# Patient Record
Sex: Female | Born: 1975
Health system: Southern US, Community
[De-identification: ages and names within clinical notes are randomized; demographics above are authoritative.]

## PROBLEM LIST (undated history)

## (undated) ENCOUNTER — Emergency Department (HOSPITAL_COMMUNITY): Payer: Medicaid Other

## (undated) ENCOUNTER — Inpatient Hospital Stay (HOSPITAL_COMMUNITY): Payer: Commercial Managed Care - HMO

## (undated) DIAGNOSIS — F410 Panic disorder [episodic paroxysmal anxiety] without agoraphobia: Secondary | ICD-10-CM

## (undated) DIAGNOSIS — R600 Localized edema: Secondary | ICD-10-CM

## (undated) DIAGNOSIS — Z8659 Personal history of other mental and behavioral disorders: Secondary | ICD-10-CM

## (undated) DIAGNOSIS — D509 Iron deficiency anemia, unspecified: Secondary | ICD-10-CM

## (undated) DIAGNOSIS — K5900 Constipation, unspecified: Secondary | ICD-10-CM

## (undated) DIAGNOSIS — D500 Iron deficiency anemia secondary to blood loss (chronic): Secondary | ICD-10-CM

## (undated) DIAGNOSIS — D259 Leiomyoma of uterus, unspecified: Secondary | ICD-10-CM

## (undated) HISTORY — PX: NO PAST SURGERIES: SHX2092

---

## 2000-03-01 ENCOUNTER — Encounter: Payer: Self-pay | Admitting: Emergency Medicine

## 2000-03-01 ENCOUNTER — Emergency Department (HOSPITAL_COMMUNITY): Admission: EM | Admit: 2000-03-01 | Discharge: 2000-03-01 | Payer: Self-pay | Admitting: Emergency Medicine

## 2000-05-06 ENCOUNTER — Ambulatory Visit (HOSPITAL_COMMUNITY): Admission: RE | Admit: 2000-05-06 | Discharge: 2000-05-06 | Payer: Self-pay | Admitting: *Deleted

## 2000-05-18 ENCOUNTER — Inpatient Hospital Stay (HOSPITAL_COMMUNITY): Admission: AD | Admit: 2000-05-18 | Discharge: 2000-05-18 | Payer: Self-pay | Admitting: *Deleted

## 2000-06-05 ENCOUNTER — Emergency Department (HOSPITAL_COMMUNITY): Admission: EM | Admit: 2000-06-05 | Discharge: 2000-06-05 | Payer: Self-pay | Admitting: Emergency Medicine

## 2000-06-30 ENCOUNTER — Ambulatory Visit (HOSPITAL_COMMUNITY): Admission: RE | Admit: 2000-06-30 | Discharge: 2000-06-30 | Payer: Self-pay | Admitting: Obstetrics & Gynecology

## 2000-07-24 ENCOUNTER — Encounter (HOSPITAL_COMMUNITY): Admission: RE | Admit: 2000-07-24 | Discharge: 2000-08-25 | Payer: Self-pay | Admitting: Obstetrics & Gynecology

## 2000-07-31 ENCOUNTER — Observation Stay (HOSPITAL_COMMUNITY): Admission: AD | Admit: 2000-07-31 | Discharge: 2000-08-01 | Payer: Self-pay | Admitting: Obstetrics

## 2000-08-06 ENCOUNTER — Encounter: Admission: RE | Admit: 2000-08-06 | Discharge: 2000-08-06 | Payer: Self-pay | Admitting: Obstetrics

## 2000-08-13 ENCOUNTER — Encounter: Admission: RE | Admit: 2000-08-13 | Discharge: 2000-08-13 | Payer: Self-pay | Admitting: Obstetrics

## 2000-08-20 ENCOUNTER — Encounter: Admission: RE | Admit: 2000-08-20 | Discharge: 2000-08-20 | Payer: Self-pay | Admitting: Obstetrics

## 2000-08-24 ENCOUNTER — Inpatient Hospital Stay (HOSPITAL_COMMUNITY): Admission: AD | Admit: 2000-08-24 | Discharge: 2000-08-27 | Payer: Self-pay | Admitting: Obstetrics

## 2000-08-24 ENCOUNTER — Encounter (INDEPENDENT_AMBULATORY_CARE_PROVIDER_SITE_OTHER): Payer: Self-pay

## 2000-08-26 HISTORY — PX: TUBAL LIGATION: SHX77

## 2000-08-27 ENCOUNTER — Encounter: Admission: RE | Admit: 2000-08-27 | Discharge: 2000-08-27 | Payer: Self-pay | Admitting: Obstetrics

## 2000-08-28 ENCOUNTER — Encounter: Admission: RE | Admit: 2000-08-28 | Discharge: 2000-09-08 | Payer: Self-pay | Admitting: Obstetrics

## 2002-07-22 ENCOUNTER — Emergency Department (HOSPITAL_COMMUNITY): Admission: EM | Admit: 2002-07-22 | Discharge: 2002-07-22 | Payer: Self-pay | Admitting: Emergency Medicine

## 2002-10-30 ENCOUNTER — Emergency Department (HOSPITAL_COMMUNITY): Admission: EM | Admit: 2002-10-30 | Discharge: 2002-10-30 | Payer: Self-pay | Admitting: Emergency Medicine

## 2003-04-13 ENCOUNTER — Emergency Department (HOSPITAL_COMMUNITY): Admission: EM | Admit: 2003-04-13 | Discharge: 2003-04-13 | Payer: Self-pay | Admitting: Emergency Medicine

## 2003-08-25 ENCOUNTER — Emergency Department (HOSPITAL_COMMUNITY): Admission: EM | Admit: 2003-08-25 | Discharge: 2003-08-25 | Payer: Self-pay | Admitting: *Deleted

## 2004-01-26 ENCOUNTER — Emergency Department (HOSPITAL_COMMUNITY): Admission: EM | Admit: 2004-01-26 | Discharge: 2004-01-26 | Payer: Self-pay | Admitting: Emergency Medicine

## 2004-03-20 ENCOUNTER — Emergency Department (HOSPITAL_COMMUNITY): Admission: EM | Admit: 2004-03-20 | Discharge: 2004-03-20 | Payer: Self-pay | Admitting: Emergency Medicine

## 2004-10-03 ENCOUNTER — Emergency Department (HOSPITAL_COMMUNITY): Admission: EM | Admit: 2004-10-03 | Discharge: 2004-10-03 | Payer: Self-pay | Admitting: Emergency Medicine

## 2004-11-19 ENCOUNTER — Emergency Department (HOSPITAL_COMMUNITY): Admission: EM | Admit: 2004-11-19 | Discharge: 2004-11-19 | Payer: Self-pay | Admitting: Emergency Medicine

## 2004-12-10 ENCOUNTER — Emergency Department (HOSPITAL_COMMUNITY): Admission: EM | Admit: 2004-12-10 | Discharge: 2004-12-10 | Payer: Self-pay | Admitting: Emergency Medicine

## 2005-06-25 ENCOUNTER — Emergency Department (HOSPITAL_COMMUNITY): Admission: EM | Admit: 2005-06-25 | Discharge: 2005-06-25 | Payer: Self-pay | Admitting: Family Medicine

## 2006-04-21 ENCOUNTER — Emergency Department (HOSPITAL_COMMUNITY): Admission: EM | Admit: 2006-04-21 | Discharge: 2006-04-22 | Payer: Self-pay | Admitting: Emergency Medicine

## 2006-10-16 ENCOUNTER — Encounter: Admission: RE | Admit: 2006-10-16 | Discharge: 2006-10-16 | Payer: Self-pay | Admitting: *Deleted

## 2007-03-25 ENCOUNTER — Emergency Department (HOSPITAL_COMMUNITY): Admission: EM | Admit: 2007-03-25 | Discharge: 2007-03-25 | Payer: Self-pay | Admitting: Emergency Medicine

## 2008-08-07 ENCOUNTER — Emergency Department (HOSPITAL_COMMUNITY): Admission: EM | Admit: 2008-08-07 | Discharge: 2008-08-07 | Payer: Self-pay | Admitting: Emergency Medicine

## 2009-12-08 ENCOUNTER — Emergency Department (HOSPITAL_COMMUNITY): Admission: EM | Admit: 2009-12-08 | Discharge: 2009-12-08 | Payer: Self-pay | Admitting: Family Medicine

## 2010-09-12 LAB — RAPID STREP SCREEN (MED CTR MEBANE ONLY): Streptococcus, Group A Screen (Direct): POSITIVE — AB

## 2010-10-18 NOTE — Op Note (Signed)
[facility]  Patient:    Susan Cruz, Susan Cruz                     MRN: [id number] Proc. Date: 08/26/00 Adm. Date:  [id number] Attending:  Tammi Sou                           Operative Report  PREOPERATIVE DIAGNOSIS:       Desires sterilization.  POSTOPERATIVE DIAGNOSIS:      Desires sterilization.  PROCEDURE:                    Bilateral partial salpingectomy                               (Pomeroy technique).  SURGEON:                      Charles A. Clearance Coots, M.D.  ASSISTANT:                    Nancy Fetter, M.D.  ANESTHESIA:                   Epidural.  ESTIMATED BLOOD LOSS:         Negligible.  COMPLICATIONS:                None.  SPECIMENS:                    Approximately 2 cm segments of right and left                               fallopian tubes.  OPERATION:                    The patient was brought to the operating room, and, after satisfactory redosing of the epidural, the abdomen was prepped and draped in the usual sterile fashion.  A small inferior umbilical incision was made with the scalpel, that was deepened down into the fascia with curved Mayo scissors.  The fascia was grasped in the midline with Kelly forceps and with cutting between with curved Mayo scissors.  The fascial incision was extended to the left and the right with the curved Mayo scissors.  Peritoneum was entered bluntly with the right-angled retractors.  The right fallopian tube was identified and was grasped with Babcock clamp.  The tube was followed from the corneal end to the fimbrial end serially, and then grasped with Babcock clamp.  The tube was then regrasped in the isthmic area of the tube with the Babcock clamp.  A knuckle of tube beneath the Babcock clamp was doubly ligated with #1 plain catgut.  Dissection of tube knot was excised with Metzenbaum scissors and submitted to pathology for evaluation.  There was no active bleeding from the  tubal stump; therefore, placed back in the normal anatomic position.  The same procedure was performed on the opposite side without complications.  The abdomen was then closed as follows.  The peritoneum and fascia was closed as one with a continuous suture of 2-0 Vicryl.  The skin was closed with a continuous subcuticular suture of 4-0 Monocryl.  Sterile bandage was applied to the incision closure.  The surgical technician indicated that all sponge, needle and instrument counts were  correct.  The patient tolerated the procedure well and was transported to the recovery room in satisfactory condition. DD:  08/26/00 TD:  08/27/00 Job: [id number]

## 2013-02-08 ENCOUNTER — Encounter (HOSPITAL_COMMUNITY): Payer: Self-pay | Admitting: *Deleted

## 2013-02-08 ENCOUNTER — Emergency Department (INDEPENDENT_AMBULATORY_CARE_PROVIDER_SITE_OTHER)
Admission: EM | Admit: 2013-02-08 | Discharge: 2013-02-08 | Disposition: A | Discharge status: Home / Self Care | Payer: Self-pay | Source: Home / Self Care | Attending: Emergency Medicine | Admitting: Emergency Medicine

## 2013-02-08 DIAGNOSIS — N3000 Acute cystitis without hematuria: Secondary | ICD-10-CM

## 2013-02-08 LAB — POCT URINALYSIS DIP (DEVICE)
Bilirubin Urine: NEGATIVE
Glucose, UA: NEGATIVE mg/dL
Ketones, ur: NEGATIVE mg/dL
Leukocytes, UA: NEGATIVE
Nitrite: POSITIVE — AB
Protein, ur: NEGATIVE mg/dL
Specific Gravity, Urine: 1.025 (ref 1.005–1.030)
Urobilinogen, UA: 0.2 mg/dL (ref 0.0–1.0)
pH: 6 (ref 5.0–8.0)

## 2013-02-08 LAB — POCT PREGNANCY, URINE: Preg Test, Ur: NEGATIVE

## 2013-02-08 MED ORDER — CEPHALEXIN 500 MG PO CAPS
500.0000 mg | ORAL_CAPSULE | Freq: Three times a day (TID) | ORAL | Status: DC
Start: 2013-02-08 — End: 2013-02-15

## 2013-02-08 MED ORDER — PHENAZOPYRIDINE HCL 200 MG PO TABS: 200.0000 mg | ORAL_TABLET | Freq: Three times a day (TID) | ORAL | Status: DC | PRN

## 2013-02-08 NOTE — ED Notes (Signed)
Pt  Reports  Symptoms  Of  Dysuria  As  Well       As         A  Foul  Odor  Present       She reports  She  Was  Treated  For  E  Coli         In her  Urine  And  Was  Given anti biotics    She    denys  Any      Vaginal bleeding    Or  Overt  Discharge     She  Ambulated  To room with a  Slow  Steady  Gait  She  Is  Sitting  Upright on  Exam table  In no  Acute  Distress

## 2013-02-08 NOTE — ED Provider Notes (Signed)
Chief Complaint:   Chief Complaint  Patient presents with  . Dysuria    History of Present Illness:   Susan Cruz is a 37 year old female who has had a 2 month history of urinary odor and a one-month history of dysuria, frequency, and lower bowel pain. She denies any fever, chills, nausea, vomiting, hematuria, or GYN complaints. She has a history of an Escherichia coli urinary tract infection about 6 months ago.  Review of Systems:  Other than noted above, the patient denies any of the following symptoms: General:  No fevers, chills, sweats, aches, or fatigue. GI:  No abdominal pain, back pain, nausea, vomiting, diarrhea, or constipation. GU:  No dysuria, frequency, urgency, hematuria, urethral discharge, penile lesions, penile pain, testicular pain, swelling, or mass, inguinal lymphadenopathy or incontinence.  PMFSH:  Past medical history, family history, social history, meds, and allergies were reviewed.    Physical Exam:   Vital signs:  BP 126/82  Pulse 72  Temp(Src) 98.1 F (36.7 C) (Oral)  Resp 16  SpO2 99%  LMP 01/11/2013 Gen:  Alert, oriented, in no distress. Lungs:  Clear to auscultation, no wheezes, rales or rhonchi. Heart:  Regular rhythm, no gallop or murmer. Abdomen:  Flat and soft.  No tenderness to palpation, guarding, or rebound.  No hepato-splenomegaly or mass.  Bowel sounds were normally active.  No hernia. Back:  No CVA tenderness.  Skin:  Clear, warm and dry.  Labs:   Results for orders placed during the hospital encounter of 02/08/13  POCT URINALYSIS DIP (DEVICE)      Result Value Range   Glucose, UA NEGATIVE  NEGATIVE mg/dL   Bilirubin Urine NEGATIVE  NEGATIVE   Ketones, ur NEGATIVE  NEGATIVE mg/dL   Specific Gravity, Urine 1.025  1.005 - 1.030   Hgb urine dipstick MODERATE (*) NEGATIVE   pH 6.0  5.0 - 8.0   Protein, ur NEGATIVE  NEGATIVE mg/dL   Urobilinogen, UA 0.2  0.0 - 1.0 mg/dL   Nitrite POSITIVE (*) NEGATIVE   Leukocytes, UA NEGATIVE   NEGATIVE  POCT PREGNANCY, URINE      Result Value Range   Preg Test, Ur NEGATIVE  NEGATIVE    A urine culture was obtained.  Assessment: The encounter diagnosis was Acute cystitis.   No evidence of pyelonephritis.  Plan:   1.  The following meds were prescribed:   Discharge Medication List as of 02/08/2013  1:32 PM    START taking these medications   Details  cephALEXin (KEFLEX) 500 MG capsule Take 1 capsule (500 mg total) by mouth 3 (three) times daily., Starting 02/08/2013, Until Discontinued, Normal    phenazopyridine (PYRIDIUM) 200 MG tablet Take 1 tablet (200 mg total) by mouth 3 (three) times daily as needed for pain., Starting 02/08/2013, Until Discontinued, Normal       2.  The patient was instructed in symptomatic care and handouts were given. Suggested she take a probiotic and was given refills on the cephalexin so she could take one of these after intercourse. She states that symptoms do seem to correlate with sexual activity. 3.  The patient was told to return if becoming worse in any way, if no better in 3 or 4 days, and given some red flag symptoms such as fever, persistent vomiting, or severe pain that would indicate earlier return. 4.  Follow up here if necessary.     Reuben Likes, MD 02/08/13 2138

## 2013-02-09 LAB — URINE CULTURE
Colony Count: >=100000
Special Requests: NORMAL

## 2013-02-10 NOTE — ED Notes (Signed)
Urine culture: >100,000 colonies E. Coli.  Pt. adequately treated with Keflex. Susan Cruz 02/10/2013

## 2013-02-15 ENCOUNTER — Encounter (HOSPITAL_COMMUNITY): Payer: Self-pay

## 2013-02-15 ENCOUNTER — Emergency Department (HOSPITAL_COMMUNITY)
Admission: EM | Admit: 2013-02-15 | Discharge: 2013-02-15 | Disposition: A | Discharge status: Home / Self Care | Payer: Self-pay | Attending: Emergency Medicine | Admitting: Emergency Medicine

## 2013-02-15 DIAGNOSIS — R55 Syncope and collapse: Secondary | ICD-10-CM | POA: Insufficient documentation

## 2013-02-15 DIAGNOSIS — Z3202 Encounter for pregnancy test, result negative: Secondary | ICD-10-CM | POA: Insufficient documentation

## 2013-02-15 DIAGNOSIS — R5381 Other malaise: Secondary | ICD-10-CM | POA: Insufficient documentation

## 2013-02-15 LAB — CBC WITH DIFFERENTIAL/PLATELET
Basophils Absolute: 0 10*3/uL (ref 0.0–0.1)
Basophils Relative: 0 % (ref 0–1)
Eosinophils Absolute: 0 10*3/uL (ref 0.0–0.7)
Eosinophils Relative: 1 % (ref 0–5)
HCT: 37.1 % (ref 36.0–46.0)
Hemoglobin: 12.6 g/dL (ref 12.0–15.0)
Lymphocytes Relative: 23 % (ref 12–46)
Lymphs Abs: 1.1 10*3/uL (ref 0.7–4.0)
MCH: 29.8 pg (ref 26.0–34.0)
MCHC: 34 g/dL (ref 30.0–36.0)
MCV: 87.7 fL (ref 78.0–100.0)
Monocytes Absolute: 0.4 10*3/uL (ref 0.1–1.0)
Monocytes Relative: 9 % (ref 3–12)
Neutro Abs: 3.1 10*3/uL (ref 1.7–7.7)
Neutrophils Relative %: 67 % (ref 43–77)
Platelets: 207 10*3/uL (ref 150–400)
RBC: 4.23 MIL/uL (ref 3.87–5.11)
RDW: 12.5 % (ref 11.5–15.5)
WBC: 4.6 10*3/uL (ref 4.0–10.5)

## 2013-02-15 LAB — URINALYSIS, ROUTINE W REFLEX MICROSCOPIC
Bilirubin Urine: NEGATIVE
Glucose, UA: NEGATIVE mg/dL
Hgb urine dipstick: NEGATIVE
Ketones, ur: NEGATIVE mg/dL
Leukocytes, UA: NEGATIVE
Nitrite: NEGATIVE
Protein, ur: NEGATIVE mg/dL
Specific Gravity, Urine: 1.023 (ref 1.005–1.030)
Urobilinogen, UA: 1 mg/dL (ref 0.0–1.0)
pH: 6 (ref 5.0–8.0)

## 2013-02-15 LAB — COMPREHENSIVE METABOLIC PANEL
ALT: 5 U/L (ref 0–35)
AST: 16 U/L (ref 0–37)
Albumin: 3.7 g/dL (ref 3.5–5.2)
Alkaline Phosphatase: 43 U/L (ref 39–117)
BUN: 13 mg/dL (ref 6–23)
CO2: 25 mEq/L (ref 19–32)
Calcium: 9.1 mg/dL (ref 8.4–10.5)
Chloride: 102 mEq/L (ref 96–112)
Creatinine, Ser: 0.97 mg/dL (ref 0.50–1.10)
GFR calc Af Amer: 87 mL/min — ABNORMAL LOW (ref >90)
GFR calc non Af Amer: 75 mL/min — ABNORMAL LOW (ref >90)
Glucose, Bld: 124 mg/dL — ABNORMAL HIGH (ref 70–99)
Potassium: 3.4 mEq/L — ABNORMAL LOW (ref 3.5–5.1)
Sodium: 137 mEq/L (ref 135–145)
Total Bilirubin: 0.4 mg/dL (ref 0.3–1.2)
Total Protein: 6.9 g/dL (ref 6.0–8.3)

## 2013-02-15 LAB — PREGNANCY, URINE: Preg Test, Ur: NEGATIVE

## 2013-02-15 MED ORDER — SODIUM CHLORIDE 0.9 % IV BOLUS (SEPSIS)
1000.0000 mL | Freq: Once | INTRAVENOUS | Status: AC
  Administered 2013-02-15: 1000 mL via INTRAVENOUS

## 2013-02-15 NOTE — ED Notes (Signed)
Pt continues to wait for initial MD assessment.

## 2013-02-15 NOTE — ED Notes (Signed)
Patient reports that she was walking to the store and felt dizzy, then she felt like she was going to pass out. Patient states she has not eaten today. Patient states she has been drinking fluids.Patient denies any history of seizures, diabetes.

## 2013-02-15 NOTE — ED Notes (Signed)
Staff gave pt ginger ale, and saltine crackers

## 2013-02-15 NOTE — ED Provider Notes (Signed)
CSN: [id number]     Arrival date & time 02/15/13  1750 History   First MD Initiated Contact with Patient 02/15/13 1939     Chief Complaint  Patient presents with  . lethargic   . Dizziness   (Consider location/radiation/quality/duration/timing/severity/associated sxs/prior Treatment) HPI Pt presenting with c/o syncope.  She states she was walking to the store and felt somewhat weak and dizzy on her way.  Does not remember events of syncope but was inside store and afterwards people were around her helping her.  No report of seizure activity.  Denies chest pain, no palpitations, no severe headache.  She states she had not eaten today or had much to drink.  Felt somewhat weak upon arrival, has been improving with fluids.  There are no other associated systemic symptoms, there are no other alleviating or modifying factors.   History reviewed. No pertinent past medical history. History reviewed. No pertinent past surgical history. History reviewed. No pertinent family history. History  Substance Use Topics  . Smoking status: Never Smoker   . Smokeless tobacco: Never Used  . Alcohol Use: No   OB History   Grav Para Term Preterm Abortions TAB SAB Ect Mult Living                 Review of Systems ROS reviewed and all otherwise negative except for mentioned in HPI  Allergies  Stadol  Home Medications   Current Outpatient Rx  Name  Route  Sig  Dispense  Refill  . ibuprofen (ADVIL,MOTRIN) 200 MG tablet   Oral   Take 400 mg by mouth every 6 (six) hours as needed for pain, fever or headache.         . phenazopyridine (PYRIDIUM) 200 MG tablet   Oral   Take 1 tablet (200 mg total) by mouth 3 (three) times daily as needed for pain.   15 tablet   0    BP 102/61  Pulse 62  Temp(Src) 98.6 F (37 C) (Oral)  Resp 15  SpO2 99%  LMP 01/11/2013 Vitals reviewed Physical Exam Physical Examination: General appearance - alert, well appearing, and in no distress Mental status - alert,  oriented to person, place, and time Eyes - pupils equal and reactive, extraocular eye movements intact Mouth - mucous membranes moist, pharynx normal without lesions Chest - clear to auscultation, no wheezes, rales or rhonchi, symmetric air entry Heart - normal rate, regular rhythm, normal S1, S2, no murmurs, rubs, clicks or gallops Abdomen - soft, nontender, nondistended, no masses or organomegaly Neurological - alert, oriented, normal speech, normal gait, strength 5/5 in extremities x 4, sensation intact Extremities - peripheral pulses normal, no pedal edema, no clubbing or cyanosis Skin - normal coloration and turgor, no rashes  ED Course  Procedures (including critical care time)  Date: 02/15/2013  Rate: 77  Rhythm: normal sinus rhythm  QRS Axis: normal  Intervals: normal  ST/T Wave abnormalities: nonspecific st/t wave abnormalities  Conduction Disutrbances:none  Narrative Interpretation:   Old EKG Reviewed: no significant changes compared to prior ekg of 10/03/2004    Labs Review Labs Reviewed  COMPREHENSIVE METABOLIC PANEL - Abnormal; Notable for the following:    Potassium 3.4 (*)    Glucose, Bld 124 (*)    GFR calc non Af Amer 75 (*)    GFR calc Af Amer 87 (*)    All other components within normal limits  CBC WITH DIFFERENTIAL  URINALYSIS, ROUTINE W REFLEX MICROSCOPIC  PREGNANCY, URINE   Imaging  Review No results found.  MDM   1. Syncope    Pt presents after syncopal episode today.  She felt weak and then syncopized.  Pt initially hypotensive, responded well to IV hydration.  She was mildly orthostatic on check of vitals- received another liter of fluids.  No significant anemia, EKG reassuring.  Encouraged increased hydration.  Discharged with strict return precautions.  Pt agreeable with plan.    Ethelda Chick, MD 02/18/13 [phone]

## 2013-02-15 NOTE — ED Notes (Signed)
Pt was able to tolerate the ginger ale and saltine crackers

## 2013-02-15 NOTE — ED Notes (Signed)
Per EMS- Patient walked to a convenience store and bystanders saw patient slump to the concrete. Ems reported that the initial BP was 60-palpated. Patient was given NS and BP increased to 108/64 HR-55. Patient c/o weakness. Alert and oriented.i

## 2014-12-06 ENCOUNTER — Encounter (HOSPITAL_COMMUNITY): Payer: Self-pay | Admitting: *Deleted

## 2014-12-06 ENCOUNTER — Emergency Department (HOSPITAL_COMMUNITY): Payer: Self-pay

## 2014-12-06 ENCOUNTER — Emergency Department (HOSPITAL_COMMUNITY)
Admission: EM | Admit: 2014-12-06 | Discharge: 2014-12-06 | Disposition: A | Discharge status: Home / Self Care | Payer: Self-pay | Attending: Emergency Medicine | Admitting: Emergency Medicine

## 2014-12-06 DIAGNOSIS — F419 Anxiety disorder, unspecified: Secondary | ICD-10-CM | POA: Insufficient documentation

## 2014-12-06 DIAGNOSIS — R0789 Other chest pain: Secondary | ICD-10-CM | POA: Insufficient documentation

## 2014-12-06 DIAGNOSIS — R0602 Shortness of breath: Secondary | ICD-10-CM | POA: Insufficient documentation

## 2014-12-06 HISTORY — DX: Panic disorder (episodic paroxysmal anxiety): F41.0

## 2014-12-06 LAB — BRAIN NATRIURETIC PEPTIDE: B Natriuretic Peptide: 12.1 pg/mL (ref 0.0–100.0)

## 2014-12-06 LAB — I-STAT TROPONIN, ED: TROPONIN I, POC: 0.01 ng/mL (ref 0.00–0.08)

## 2014-12-06 LAB — CBC
HCT: 38.9 % (ref 36.0–46.0)
Hemoglobin: 13.5 g/dL (ref 12.0–15.0)
MCH: 30.8 pg (ref 26.0–34.0)
MCHC: 34.7 g/dL (ref 30.0–36.0)
MCV: 88.6 fL (ref 78.0–100.0)
Platelets: 275 10*3/uL (ref 150–400)
RBC: 4.39 MIL/uL (ref 3.87–5.11)
RDW: 13 % (ref 11.5–15.5)
WBC: 5.7 10*3/uL (ref 4.0–10.5)

## 2014-12-06 LAB — BASIC METABOLIC PANEL
ANION GAP: 8 (ref 5–15)
BUN: 7 mg/dL (ref 6–20)
CALCIUM: 9 mg/dL (ref 8.9–10.3)
CO2: 26 mmol/L (ref 22–32)
Chloride: 103 mmol/L (ref 101–111)
Creatinine, Ser: 0.94 mg/dL (ref 0.44–1.00)
GFR calc Af Amer: >60 mL/min (ref >60)
GFR calc non Af Amer: >60 mL/min (ref >60)
Glucose, Bld: 96 mg/dL (ref 65–99)
POTASSIUM: 3.9 mmol/L (ref 3.5–5.1)
SODIUM: 137 mmol/L (ref 135–145)

## 2014-12-06 NOTE — ED Notes (Addendum)
Pt c/o CP for the past 15 minutes. Pt tearful at triage. Reports hx panic attacks.

## 2014-12-06 NOTE — ED Provider Notes (Signed)
CSN: [id number]     Arrival date & time 12/06/14  1704 History   First MD Initiated Contact with Patient 12/06/14 2043     Chief Complaint  Patient presents with  . Chest Pain     (Consider location/radiation/quality/duration/timing/severity/associated sxs/prior Treatment) HPI Comments: Patient is a 39 year old female past medical history significant for panic attacks presenting to the emergency department for acute onset right-sided chest pressure with radiation to left chest that began 15 minutes prior to arrival. Endorses associated shortness of breath without nausea, vomiting, diaphoresis. No modifying factors identified. Versus a history of chest pain and anxiety and techs past as severe. Pain is currently 0 out of 10. No cardiac history. No early familial cardiac history. PERC negative. No history of echocardiogram, stress test, cardiac catheterization.  Patient is a 39 y.o. female presenting with chest pain. The history is provided by the patient.  Chest Pain Pain location:  R chest Pain quality: pressure   Radiates to: Left chest. Pain radiates to the back: no   Pain severity:  Severe Onset quality:  Sudden Duration:  4 hours Timing:  Constant Progression:  Resolved Chronicity:  New Relieved by:  Rest Worsened by:  Nothing tried Ineffective treatments:  None tried Associated symptoms: anxiety and shortness of breath   Associated symptoms: no fever, no lower extremity edema, no syncope and not vomiting   Risk factors: no birth control, no high cholesterol, no hypertension and no prior DVT/PE     Past Medical History  Diagnosis Date  . Panic attacks    History reviewed. No pertinent past surgical history. No family history on file. History  Substance Use Topics  . Smoking status: Never Smoker   . Smokeless tobacco: Never Used  . Alcohol Use: Yes     Comment: social   OB History    No data available     Review of Systems  Constitutional: Negative for fever.   Respiratory: Positive for shortness of breath.   Cardiovascular: Positive for chest pain. Negative for syncope.  Gastrointestinal: Negative for vomiting.  All other systems reviewed and are negative.     Allergies  Stadol  Home Medications   Prior to Admission medications   Medication Sig Start Date End Date Taking? Authorizing Provider  ibuprofen (ADVIL,MOTRIN) 200 MG tablet Take 400 mg by mouth every 6 (six) hours as needed for pain, fever or headache.   Yes Historical Provider, MD   BP 123/77 mmHg  Pulse 59  Temp(Src) 98 F (36.7 C) (Oral)  Resp 18  SpO2 100%  LMP 11/22/2014 Physical Exam  Constitutional: She is oriented to person, place, and time. She appears well-developed and well-nourished. No distress.  HENT:  Head: Normocephalic and atraumatic.  Right Ear: External ear normal.  Left Ear: External ear normal.  Nose: Nose normal.  Eyes: Conjunctivae are normal.  Neck: Neck supple.  Cardiovascular: Normal rate, regular rhythm and normal heart sounds.   Pulmonary/Chest: Effort normal and breath sounds normal. She exhibits tenderness.  Abdominal: Soft. There is no tenderness.  Musculoskeletal: Normal range of motion. She exhibits no edema.  Neurological: She is alert and oriented to person, place, and time.  Skin: Skin is warm and dry. She is not diaphoretic.  Nursing note and vitals reviewed.   ED Course  Procedures (including critical care time) Medications - No data to display  [place], ED    Imaging Review Dg  Chest 2 View  12/06/2014   CLINICAL DATA:  Chest pain and shortness of breath this evening.  EXAM: CHEST  2 VIEW  COMPARISON:  PA and lateral chest 10/03/2004.  FINDINGS: The lungs are clear. Heart size is normal. No pneumothorax or pleural effusion.  IMPRESSION: No acute disease.   Electronically Signed   By: Inge Rise M.D.   On: 12/06/2014 18:18      EKG Interpretation   Date/Time:  Wednesday December 06 2014 17:13:34 EDT Ventricular Rate:  63 PR Interval:  122 QRS Duration: 62 QT Interval:  406 QTC Calculation: 415 R Axis:   80 Text Interpretation:  Normal sinus rhythm Normal ECG Confirmed by Ashok Cordia   MD, Lennette Bihari ([id number]) on 12/06/2014 8:45:12 PM      MDM   Final diagnoses:  Chest pain, atypical    Filed Vitals:   12/06/14 1944  BP: 123/77  Pulse: 59  Temp: 98 F (36.7 C)  Resp: 18   Afebrile, NAD, non-toxic appearing, AAOx4.   Patient is to be discharged with recommendation to follow up with PCP in regards to today's hospital visit. Chest pain is not likely of cardiac or pulmonary etiology d/t presentation, perc negative, VSS, no tracheal deviation, no JVD or new murmur, RRR, breath sounds equal bilaterally, EKG without acute abnormalities, negative troponin, and negative CXR. Pt has been advised to return to the ED is CP becomes exertional, associated with diaphoresis or nausea, radiates to left jaw/arm, worsens or becomes concerning in any way. Pt appears reliable for follow up and is agreeable to discharge. Patient is stable at time of discharge        Baron Sane, PA-C 12/07/14 [place], MD 12/11/14 [phone]

## 2014-12-06 NOTE — Discharge Instructions (Signed)
Please follow up with your primary care physician in 1-2 days. If you do not have one please call the [facility] number listed above. Please read all discharge instructions and return precautions.    Chest Pain (Nonspecific) It is often hard to give a specific diagnosis for the cause of chest pain. There is always a chance that your pain could be related to something serious, such as a heart attack or a blood clot in the lungs. You need to follow up with your health care provider for further evaluation. CAUSES   Heartburn.  Pneumonia or bronchitis.  Anxiety or stress.  Inflammation around your heart (pericarditis) or lung (pleuritis or pleurisy).  A blood clot in the lung.  A collapsed lung (pneumothorax). It can develop suddenly on its own (spontaneous pneumothorax) or from trauma to the chest.  Shingles infection (herpes zoster virus). The chest wall is composed of bones, muscles, and cartilage. Any of these can be the source of the pain.  The bones can be bruised by injury.  The muscles or cartilage can be strained by coughing or overwork.  The cartilage can be affected by inflammation and become sore (costochondritis). DIAGNOSIS  Lab tests or other studies may be needed to find the cause of your pain. Your health care provider may have you take a test called an ambulatory electrocardiogram (ECG). An ECG records your heartbeat patterns over a 24-hour period. You may also have other tests, such as:  Transthoracic echocardiogram (TTE). During echocardiography, sound waves are used to evaluate how blood flows through your heart.  Transesophageal echocardiogram (TEE).  Cardiac monitoring. This allows your health care provider to monitor your heart rate and rhythm in real time.  Holter monitor. This is a portable device that records your heartbeat and can help diagnose heart arrhythmias. It allows your health care provider to track your heart activity for  several days, if needed.  Stress tests by exercise or by giving medicine that makes the heart beat faster. TREATMENT   Treatment depends on what may be causing your chest pain. Treatment may include:  Acid blockers for heartburn.  Anti-inflammatory medicine.  Pain medicine for inflammatory conditions.  Antibiotics if an infection is present.  You may be advised to change lifestyle habits. This includes stopping smoking and avoiding alcohol, caffeine, and chocolate.  You may be advised to keep your head raised (elevated) when sleeping. This reduces the chance of acid going backward from your stomach into your esophagus. Most of the time, nonspecific chest pain will improve within 2-3 days with rest and mild pain medicine.  HOME CARE INSTRUCTIONS   If antibiotics were prescribed, take them as directed. Finish them even if you start to feel better.  For the next few days, avoid physical activities that bring on chest pain. Continue physical activities as directed.  Do not use any tobacco products, including cigarettes, chewing tobacco, or electronic cigarettes.  Avoid drinking alcohol.  Only take medicine as directed by your health care provider.  Follow your health care provider's suggestions for further testing if your chest pain does not go away.  Keep any follow-up appointments you made. If you do not go to an appointment, you could develop lasting (chronic) problems with pain. If there is any problem keeping an appointment, call to reschedule. SEEK MEDICAL CARE IF:   Your chest pain does not go away, even after treatment.  You have a rash with blisters on your chest.  You have a fever.  SEEK IMMEDIATE MEDICAL CARE IF:   You have increased chest pain or pain that spreads to your arm, neck, jaw, back, or abdomen.  You have shortness of breath.  You have an increasing cough, or you cough up blood.  You have severe back or abdominal pain.  You feel nauseous or  vomit.  You have severe weakness.  You faint.  You have chills. This is an emergency. Do not wait to see if the pain will go away. Get medical help at once. Call your local emergency services (911 in [place] Do not drive yourself to the hospital. MAKE SURE YOU:   Understand these instructions.  Will watch your condition.  Will get help right away if you are not doing well or get worse. Document Released: 02/26/2005 Document Revised: 05/24/2013 Document Reviewed: 12/23/2007 [facility] Patient Information 2015 [place], [place]. This information is not intended to replace advice given to you by your health care provider. Make sure you discuss any questions you have with your health care provider.

## 2017-01-13 ENCOUNTER — Ambulatory Visit (INDEPENDENT_AMBULATORY_CARE_PROVIDER_SITE_OTHER): Payer: Medicaid Other | Admitting: Internal Medicine

## 2017-01-13 ENCOUNTER — Ambulatory Visit (HOSPITAL_COMMUNITY)
Admission: RE | Admit: 2017-01-13 | Discharge: 2017-01-13 | Disposition: A | Discharge status: Home / Self Care | Payer: Medicaid Other | Source: Ambulatory Visit | Attending: Family Medicine | Admitting: Family Medicine

## 2017-01-13 ENCOUNTER — Encounter: Payer: Self-pay | Admitting: Internal Medicine

## 2017-01-13 DIAGNOSIS — R0789 Other chest pain: Secondary | ICD-10-CM | POA: Diagnosis present

## 2017-01-13 DIAGNOSIS — Z1159 Encounter for screening for other viral diseases: Secondary | ICD-10-CM | POA: Diagnosis not present

## 2017-01-13 DIAGNOSIS — R001 Bradycardia, unspecified: Secondary | ICD-10-CM | POA: Insufficient documentation

## 2017-01-13 NOTE — Assessment & Plan Note (Signed)
Atypical Chest pain, normal EKG with benign PVCs - likely esophogeal spasms vs PVCs cause patient to palpations  - EKG 12-Lead - CBC - CMP14+EGFR - Follow up in 1 -2 weeks

## 2017-01-13 NOTE — Patient Instructions (Signed)
Nice to meet you. Please follow-up in 1-2 weeks to get a Pap smear and discuss other medical issues.

## 2017-01-13 NOTE — Progress Notes (Signed)
   [facility] Kerrin Mo, MD Phone: [phone]  Reason For Visit: Establish care   # Chest pain Has been going for several years. 3-4 for days in weeks has intermittent chest pain.  Patient states the chest pain shoots across. Patient describes the pain as cramping. Pain last for about 1 minutes, - cramping on and off for about couple of minutes. Not exacerbated by activity. Does not noticed any worsening, or better. When patient clutches and this seems to help relieve some the pain. Patient denies pain radiating anywhere. Denies any association with food, spicy food, after meals etc. Denies any hx of reflux   Symptoms History of Trauma/lifting: no  Nausea/vomiting: no  Diaphoresis: no  Shortness of breath: no  Pleuritic: no  Cough: no  Edema: no  Orthopnea: no  PND: no  Dizziness: no  Palpitations: yes, sometimes feels like heart is beating fast, other times does not note any abnormalities  Syncope: no  Indigestion: no   Red Flags Worse with exertion: no  Recent Immobility: no  Cancer history: no  Tearing/radiation to back: no   Past Medical History Reviewed problem list.  Medications- reviewed and updated No additions to family history Social history- patient is a non-smoker  Objective: BP 128/78   Pulse 64   Temp 98.3 F (36.8 C) (Oral)   Wt 111 lb 6.4 oz (50.5 kg)   SpO2 100%  Gen: NAD, alert, cooperative with exam HEENT: Normal    Neck: No masses palpated. No lymphadenopathy    Ears: Tympanic membranes intact, normal light reflex, no erythema, no bulging    Eyes: PERRLA, EOMI    Nose: nasal turbinates moist    Throat: moist mucus membranes, no erythema Cardio: regular rate and rhythm, S1S2 heard, no murmurs appreciated Pulm: clear to auscultation bilaterally, no wheezes, rhonchi or rales GI: soft, non-tender, non-distended, bowel sounds present, no hepatomegaly, no splenomegaly Extremities: warm, well perfused, No edema, cyanosis  or clubbing;  MSK: Normal gait and station Skin: dry, intact, no rashes or lesions Neuro: Strength and sensation grossly intact  Endorse chest pain, constipation. Patient reports no  vision/ hearing changes,anorexia, weight change, fever ,adenopathy, persistant / recurrent hoarseness, swallowing issues, edema,persistant / recurrent cough, hemoptysis, dyspnea(rest, exertional, paroxysmal nocturnal), gastrointestinal  bleeding (melena, rectal bleeding), abdominal pain, excessive heart burn, GU symptoms(dysuria, hematuria, pyuria, voiding/incontinence  Issues) syncope, focal weakness, severe memory loss, concerning skin lesions, depression, anxiety, abnormal bruising/bleeding, major joint swelling, breast masses or abnormal vaginal bleeding.     Assessment/Plan: See problem based a/p  Atypical chest pain Atypical Chest pain, normal EKG with benign PVCs - likely esophogeal spasms vs PVCs cause patient to palpations  - EKG 12-Lead - CBC - CMP14+EGFR - Follow up in 1 -2 weeks

## 2017-01-14 LAB — CMP14+EGFR
A/G RATIO: 1.9 (ref 1.2–2.2)
ALT: 6 IU/L (ref 0–32)
AST: 17 IU/L (ref 0–40)
Albumin: 4.6 g/dL (ref 3.5–5.5)
Alkaline Phosphatase: 43 IU/L (ref 39–117)
BUN/Creatinine Ratio: 9 (ref 9–23)
BUN: 8 mg/dL (ref 6–20)
Bilirubin Total: 0.4 mg/dL (ref 0.0–1.2)
CALCIUM: 9.5 mg/dL (ref 8.7–10.2)
CO2: 26 mmol/L (ref 20–29)
CREATININE: 0.94 mg/dL (ref 0.57–1.00)
Chloride: 101 mmol/L (ref 96–106)
GFR calc Af Amer: 88 mL/min/{1.73_m2} (ref >59)
GFR, EST NON AFRICAN AMERICAN: 77 mL/min/{1.73_m2} (ref >59)
Globulin, Total: 2.4 g/dL (ref 1.5–4.5)
Glucose: 72 mg/dL (ref 65–99)
Potassium: 4.3 mmol/L (ref 3.5–5.2)
Sodium: 139 mmol/L (ref 134–144)
TOTAL PROTEIN: 7 g/dL (ref 6.0–8.5)

## 2017-01-14 LAB — HIV ANTIBODY (ROUTINE TESTING W REFLEX): HIV Screen 4th Generation wRfx: NONREACTIVE

## 2017-01-14 LAB — CBC
HEMOGLOBIN: 13.1 g/dL (ref 11.1–15.9)
Hematocrit: 39.9 % (ref 34.0–46.6)
MCH: 30.4 pg (ref 26.6–33.0)
MCHC: 32.8 g/dL (ref 31.5–35.7)
MCV: 93 fL (ref 79–97)
Platelets: 283 10*3/uL (ref 150–379)
RBC: 4.31 x10E6/uL (ref 3.77–5.28)
RDW: 13.8 % (ref 12.3–15.4)
WBC: 4.7 10*3/uL (ref 3.4–10.8)

## 2017-01-20 ENCOUNTER — Encounter: Payer: Self-pay | Admitting: Internal Medicine

## 2017-02-18 ENCOUNTER — Telehealth: Payer: Self-pay | Admitting: *Deleted

## 2017-02-18 NOTE — Telephone Encounter (Signed)
Dr. Royce Macadamia office called stating they needed a letter stating patient is clear of oral surgery. Please fax to [phone]. Derl Barrow, RN

## 2017-02-23 NOTE — Telephone Encounter (Signed)
2nd request for surgical clearance from Dr. Lupita Leash office please give them a call [phone]; fax [phone].  Derl Barrow, RN

## 2017-02-23 NOTE — Telephone Encounter (Signed)
Letter for patient clearance for oral surgery. Sent to red team to fax to dentist office.

## 2017-02-24 NOTE — Telephone Encounter (Signed)
Letter faxed to number provided.

## 2018-01-23 ENCOUNTER — Encounter (HOSPITAL_COMMUNITY): Payer: Self-pay

## 2018-01-23 ENCOUNTER — Emergency Department (HOSPITAL_COMMUNITY)
Admission: EM | Admit: 2018-01-23 | Discharge: 2018-01-23 | Disposition: A | Discharge status: Home / Self Care | Payer: Medicaid Other | Attending: Emergency Medicine | Admitting: Emergency Medicine

## 2018-01-23 ENCOUNTER — Emergency Department (HOSPITAL_COMMUNITY): Payer: Medicaid Other

## 2018-01-23 DIAGNOSIS — G44319 Acute post-traumatic headache, not intractable: Secondary | ICD-10-CM

## 2018-01-23 DIAGNOSIS — R51 Headache: Secondary | ICD-10-CM | POA: Insufficient documentation

## 2018-01-23 NOTE — ED Notes (Signed)
Discharge instructions reviewed with patient. Patient left ambulatory and in NAD.

## 2018-01-23 NOTE — ED Triage Notes (Signed)
Patient tripped and fell 1 week ago hitting head on night stand. States that she had brief loc at that time. Now complains of headache, no blurred vision, no nausea, no vomiting

## 2018-01-23 NOTE — ED Provider Notes (Signed)
[facility] Emergency Department Provider Note MRN:  [id number]  Arrival date & time: 01/23/18     Chief Complaint   hit head 1 week ago   History of Present Illness   Susan Cruz is a 42 y.o. year-old female with no pertinent past history presenting to the ED with chief complaint of headache.  1 week ago, patient tripped over some wires in her room, falling backwards and hitting the back of her head against a countertop.  Patient estimates a 10-minute loss of consciousness.  Since that time, patient has been unable to work due to persistent bilateral temporal headache.  The pain is constant, worse with loud noises, worse with attempting to concentrate.  Patient denies any fevers, no neck pain, no chest pain or shortness of breath, no other traumatic injuries.  No numbness or weakness in the arms or legs, no dizziness, no nausea, no balance issues.  Review of Systems  A complete 10 system review of systems was obtained and all systems are negative except as noted in the HPI and PMH.   Patient's Health History    Past Medical History:  Diagnosis Date  . Panic attacks     History reviewed. No pertinent surgical history.  Family History  Problem Relation Age of Onset  . Anuerysm Mother   . Stroke Father     Social History   Socioeconomic History  . Marital status: Legally Separated    Spouse name: Not on file  . Number of children: Not on file  . Years of education: Not on file  . Highest education level: Not on file  Occupational History  . Occupation: Insurance claims handler  Social Needs  . Financial resource strain: Not on file  . Food insecurity:    Worry: Not on file    Inability: Not on file  . Transportation needs:    Medical: Not on file    Non-medical: Not on file  Tobacco Use  . Smoking status: Never Smoker  . Smokeless tobacco: Never Used  Substance and Sexual Activity  . Alcohol use: Yes    Comment: social  . Drug use: No  . Sexual  activity: Not Currently  Lifestyle  . Physical activity:    Days per week: Not on file    Minutes per session: Not on file  . Stress: Not on file  Relationships  . Social connections:    Talks on phone: Not on file    Gets together: Not on file    Attends religious service: Not on file    Active member of club or organization: Not on file    Attends meetings of clubs or organizations: Not on file    Relationship status: Not on file  . Intimate partner violence:    Fear of current or ex partner: Not on file    Emotionally abused: Not on file    Physically abused: Not on file    Forced sexual activity: Not on file  Other Topics Concern  . 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 Has 3 children the last is currently in college.      Physical Exam  Vital Signs and Nursing Notes reviewed Vitals:   01/23/18 1139 01/23/18 1347  BP: (!) 151/95 (!) 145/64  Pulse: 67 60  Resp: 17 14  Temp: 98.8 F (37.1 C)   SpO2: 100% 100%  CONSTITUTIONAL: Well-appearing, NAD NEURO:  Alert and oriented x 3, no focal deficits, tenderness to palpation to the right occipital scalp with EYES:  eyes equal and reactive ENT/NECK:  no LAD, no JVD CARDIO: Regular rate, well-perfused, normal S1 and S2 PULM:  CTAB no wheezing or rhonchi GI/GU:  normal bowel sounds, non-distended, non-tender MSK/SPINE:  No gross deformities, no edema SKIN:  no rash, atraumatic PSYCH:  Appropriate speech and behavior  Diagnostic and Interventional Summary    EKG Interpretation  Date/Time:    Ventricular Rate:    PR Interval:    QRS Duration:   QT Interval:    QTC Calculation:   R Axis:     Text Interpretation:        Labs Reviewed - No data to display  CT Head Wo Contrast  Final Result      Medications - No data to display   Procedures Critical Care  ED Course and Medical Decision Making  I have reviewed the  triage vital signs and the nursing notes.  Pertinent labs & imaging results that were available during my care of the patient were reviewed by me and considered in my medical decision making (see below for details). Clinical Course as of Jan 23 1637  Sat Jan 23, 2018  1235 Favoring posttraumatic headache versus concussion in this healthy 43 year old female, but cannot exclude subdural hematoma given the nature of the fall and loss consciousness.  CT pending.   [MB]    Clinical Course User Index [MB] Maudie Flakes, MD    CT unremarkable, patient advised mental and physical rest for the past 2 days and slow progression to normal function.  Tylenol ibuprofen at home for pain.  After the discussed management above, the patient was determined to be safe for discharge.  The patient was in agreement with this plan and all questions regarding their care were answered.  ED return precautions were discussed and the patient will return to the ED with any significant worsening of condition.  Barth Kirks. Sedonia Small, [place] [email or web address]  Final Clinical Impressions(s) / ED Diagnoses     ICD-10-CM   1. Acute post-traumatic headache, not intractable G44.319     ED Discharge Orders    None         Maudie Flakes, MD 01/23/18 [phone]

## 2018-01-23 NOTE — ED Notes (Signed)
Patient transported to CT 

## 2018-01-23 NOTE — Discharge Instructions (Addendum)
You were evaluated in the Emergency Department and after careful evaluation, we did not find any emergent condition requiring admission or further testing in the hospital.  Your symptoms today seem to be due to a posttraumatic headache or possibly a mild concussion.  Please practice physical and mental rest at home for the next 2 days as we discussed.  He can use Tylenol and ibuprofen at home for pain.  After your symptoms resolve, you should gradually return to your normal daily function.  The CT of your head today showed as a possible incidental finding that should be mention to your regular doctor.  I have included the official CT read below.  CT HEAD IMPRESSION 01/23/2018: 1. No acute intracranial findings. 2. Fleck calcification on the left frontal dural margin on image 54/4 could possibly reflect a small incidental meningioma.  Please return to the Emergency Department if you experience any worsening of your condition.  We encourage you to follow up with a primary care provider.  Thank you for allowing [place] to be a part of your care.

## 2018-03-15 ENCOUNTER — Encounter (HOSPITAL_COMMUNITY): Payer: Self-pay | Admitting: Emergency Medicine

## 2018-03-15 ENCOUNTER — Ambulatory Visit (HOSPITAL_COMMUNITY)
Admission: EM | Admit: 2018-03-15 | Discharge: 2018-03-15 | Disposition: A | Discharge status: Home / Self Care | Payer: BLUE CROSS/BLUE SHIELD | Attending: Family Medicine | Admitting: Family Medicine

## 2018-03-15 ENCOUNTER — Other Ambulatory Visit: Payer: Self-pay

## 2018-03-15 DIAGNOSIS — R079 Chest pain, unspecified: Secondary | ICD-10-CM | POA: Diagnosis not present

## 2018-03-15 MED ORDER — NAPROXEN 500 MG PO TABS: 500.0000 mg | ORAL_TABLET | Freq: Two times a day (BID) | ORAL | 0 refills | Status: DC

## 2018-03-15 NOTE — ED Triage Notes (Signed)
Pt reports central right chest pressure that started on Friday.  Pt states it has been constant and getting progressively worse.

## 2018-03-15 NOTE — Discharge Instructions (Addendum)
It was nice meeting you!!  Your EKG was normal We will go ahead and treat this like musculoskeletal chest pain. Naproxen twice a day with food He could do heat or ice to the chest with gentle stretching. If your symptoms do not improve or get worse over the next couple days go ahead and go to the ER at that point

## 2018-03-15 NOTE — ED Provider Notes (Signed)
[facility]    CSN: [id number] Arrival date & time: 03/15/18  1008     History   Chief Complaint Chief Complaint  Patient presents with  . Chest Pain    HPI Susan Cruz is a 42 y.o. female.   She is a 42 year old female with past medical history of panic attacks and atypical chest pain.  She presents with 4 days of right-sided chest pain.  Symptoms have been constant and remain the same.  The pain is worse with deep breathing, moving, bending forward.  Describes it as she feels like something is sitting on her chest.  She denies any recent URI symptoms, fever.  She denies any injury to the chest, strenuous exercise, or heavy lifting.  The pain is worse with palpation of the chest.  She denies any shortness of breath or palpitations.  She denies any recent traveling, hormone use or smoking.  ROS per HPI      Past Medical History:  Diagnosis Date  . Panic attacks     Patient Active Problem List   Diagnosis Date Noted  . Atypical chest pain 01/13/2017  . Screening for viral disease 01/13/2017    History reviewed. No pertinent surgical history.  OB History   None      Home Medications    Prior to Admission medications   Medication Sig Start Date End Date Taking? Authorizing Provider  ibuprofen (ADVIL,MOTRIN) 200 MG tablet Take 400 mg by mouth every 6 (six) hours as needed for pain, fever or headache.    [provider]  naproxen (NAPROSYN) 500 MG tablet Take 1 tablet (500 mg total) by mouth 2 (two) times daily. 03/15/18   Orvan July, NP    Family History Family History  Problem Relation Age of Onset  . Anuerysm Mother   . Stroke Father     Social History Social History   Tobacco Use  . Smoking status: Never Smoker  . Smokeless tobacco: Never Used  Substance Use Topics  . Alcohol use: Yes    Comment: social  . Drug use: No     Allergies   Stadol [butorphanol]   Review of Systems Review of Systems   Physical  Exam Triage Vital Signs ED Triage Vitals [03/15/18 1033]  Enc Vitals Group     BP (!) 143/65     Pulse Rate (!) 56     Resp      Temp 98.1 F (36.7 C)     Temp Source Oral     SpO2 100 %     Weight      Height      Head Circumference      Peak Flow      Pain Score 7     Pain Loc      Pain Edu?      Excl. in [place]?    No data found.  Updated Vital Signs BP (!) 143/65 (BP Location: Left Arm)   Pulse (!) 56   Temp 98.1 F (36.7 C) (Oral)   LMP 03/12/2018 (Exact Date)   SpO2 100%   Visual Acuity Right Eye Distance:   Left Eye Distance:   Bilateral Distance:    Right Eye Near:   Left Eye Near:    Bilateral Near:     Physical Exam  Constitutional: She is oriented to person, place, and time. She appears well-developed and well-nourished.  Very pleasant. Non toxic or ill appearing.   HENT:  Head: Normocephalic and  atraumatic.  Neck: Normal range of motion.  Cardiovascular: Regular rhythm and normal pulses. Bradycardia present. Exam reveals no S3 and no S4.  Pulmonary/Chest: Effort normal and breath sounds normal.  Lungs clear in all fields. No dyspnea or distress. No retractions or nasal flaring.  Pain elicited with deep breathing  Musculoskeletal: Normal range of motion.       Right lower leg: She exhibits no tenderness and no edema.       Left lower leg: She exhibits no tenderness and no edema.  Tenderness to palpation of right-sided chest wall   Neurological: She is alert and oriented to person, place, and time.  Skin: Skin is warm and dry. No abrasion, no ecchymosis and no rash noted. She is not diaphoretic. No cyanosis or erythema. No pallor. Nails show no clubbing.  Psychiatric: She has a normal mood and affect.  Nursing note and vitals reviewed.    UC Treatments / Results  Labs (all labs ordered are listed, but only abnormal results are displayed) Labs Reviewed - No data to display  EKG None  Radiology No results found.  Procedures Procedures  (including critical care time)  Medications Ordered in UC Medications - No data to display  Initial Impression / Assessment and Plan / UC Course  I have reviewed the triage vital signs and the nursing notes.  Pertinent labs & imaging results that were available during my care of the patient were reviewed by me and considered in my medical decision making (see chart for details).     EKG normal sinus rhythm with sinus bradycardia Similar to previous EKGs This is most likely muscular skeletal chest pain We will do naproxen twice daily with food to see if this helps Heat/ice and gentle stretching Instructed that if she has continued or worsening symptoms to go the ER.  Final Clinical Impressions(s) / UC Diagnoses   Final diagnoses:  Chest pain, unspecified type     Discharge Instructions     It was nice meeting you!!  Your EKG was normal We will go ahead and treat this like musculoskeletal chest pain. Naproxen twice a day with food He could do heat or ice to the chest with gentle stretching. If your symptoms do not improve or get worse over the next couple days go ahead and go to the ER at that point     ED Prescriptions    Medication Sig Dispense Auth. Provider   naproxen (NAPROSYN) 500 MG tablet Take 1 tablet (500 mg total) by mouth 2 (two) times daily. 30 tablet Loura Halt A, NP     Controlled Substance Prescriptions [place] Controlled Substance Registry consulted? Not Applicable   Orvan July, NP 03/15/18 1127

## 2018-04-19 ENCOUNTER — Ambulatory Visit: Payer: Self-pay | Admitting: Family Medicine

## 2018-04-19 NOTE — Progress Notes (Deleted)
  No chief complaint on file.   HPI  4 review of systems  Past Medical History:  Diagnosis Date  . Panic attacks     Current Outpatient Medications  Medication Sig Dispense Refill  . ibuprofen (ADVIL,MOTRIN) 200 MG tablet Take 400 mg by mouth every 6 (six) hours as needed for pain, fever or headache.    . naproxen (NAPROSYN) 500 MG tablet Take 1 tablet (500 mg total) by mouth 2 (two) times daily. 30 tablet 0   No current facility-administered medications for this visit.     Allergies:  Allergies  Allergen Reactions  . Stadol [Butorphanol] Itching    No past surgical history on file.  Social History   Socioeconomic History  . Marital status: Legally Separated    Spouse name: Not on file  . Number of children: Not on file  . Years of education: Not on file  . Highest education level: Not on file  Occupational History  . Occupation: Insurance claims handler  Social Needs  . Financial resource strain: Not on file  . Food insecurity:    Worry: Not on file    Inability: Not on file  . Transportation needs:    Medical: Not on file    Non-medical: Not on file  Tobacco Use  . Smoking status: Never Smoker  . Smokeless tobacco: Never Used  Substance and Sexual Activity  . Alcohol use: Yes    Comment: social  . Drug use: No  . Sexual activity: Not Currently  Lifestyle  . Physical activity:    Days per week: Not on file    Minutes per session: Not on file  . Stress: Not on file  Relationships  . Social connections:    Talks on phone: Not on file    Gets together: Not on file    Attends religious service: Not on file    Active member of club or organization: Not on file    Attends meetings of clubs or organizations: Not on file    Relationship status: Not on file  Other Topics Concern  . 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 Has 3 children the last is currently in college.      Family History  Problem Relation Age of Onset  . Anuerysm Mother   . Stroke Father      ROS Review of Systems See HPI Constitution: No fevers or chills No malaise No diaphoresis Skin: No rash or itching Eyes: no blurry vision, no double vision GU: no dysuria or hematuria Neuro: no dizziness or headaches * all others reviewed and negative   Objective: There were no vitals filed for this visit.  Physical Exam  Assessment and Plan There are no diagnoses linked to this encounter.   Delores P [employer]

## 2018-06-30 ENCOUNTER — Encounter (HOSPITAL_COMMUNITY): Payer: Self-pay

## 2018-06-30 ENCOUNTER — Other Ambulatory Visit: Payer: Self-pay

## 2018-06-30 ENCOUNTER — Emergency Department (HOSPITAL_COMMUNITY)
Admission: EM | Admit: 2018-06-30 | Discharge: 2018-06-30 | Disposition: A | Discharge status: Home / Self Care | Payer: BLUE CROSS/BLUE SHIELD | Attending: Emergency Medicine | Admitting: Emergency Medicine

## 2018-06-30 DIAGNOSIS — G44219 Episodic tension-type headache, not intractable: Secondary | ICD-10-CM

## 2018-06-30 DIAGNOSIS — Z79899 Other long term (current) drug therapy: Secondary | ICD-10-CM | POA: Diagnosis not present

## 2018-06-30 DIAGNOSIS — R51 Headache: Secondary | ICD-10-CM | POA: Diagnosis not present

## 2018-06-30 MED ORDER — METOCLOPRAMIDE HCL 5 MG/ML IJ SOLN
10.0000 mg | Freq: Once | INTRAMUSCULAR | Status: AC
  Administered 2018-06-30: 10 mg via INTRAMUSCULAR
  Filled 2018-06-30: qty 2

## 2018-06-30 MED ORDER — DIPHENHYDRAMINE HCL 25 MG PO TABS: 25.0000 mg | ORAL_TABLET | Freq: Three times a day (TID) | ORAL | 0 refills | Status: DC | PRN

## 2018-06-30 MED ORDER — METOCLOPRAMIDE HCL 10 MG PO TABS: 10.0000 mg | ORAL_TABLET | Freq: Three times a day (TID) | ORAL | 0 refills | Status: DC | PRN

## 2018-06-30 MED ORDER — IBUPROFEN 600 MG PO TABS: 600.0000 mg | ORAL_TABLET | Freq: Three times a day (TID) | ORAL | 0 refills | Status: DC | PRN

## 2018-06-30 MED ORDER — DIPHENHYDRAMINE HCL 50 MG/ML IJ SOLN
25.0000 mg | Freq: Once | INTRAMUSCULAR | Status: AC
  Administered 2018-06-30: 25 mg via INTRAMUSCULAR
  Filled 2018-06-30: qty 1

## 2018-06-30 MED ORDER — KETOROLAC TROMETHAMINE 60 MG/2ML IM SOLN
60.0000 mg | Freq: Once | INTRAMUSCULAR | Status: AC
  Administered 2018-06-30: 60 mg via INTRAMUSCULAR
  Filled 2018-06-30: qty 2

## 2018-06-30 NOTE — ED Notes (Signed)
Patient verbalizes understanding of discharge instructions. Opportunity for questioning and answers were provided. Pt discharged from ED. 

## 2018-06-30 NOTE — ED Notes (Signed)
Verified with [place] from pharmacy that Reglan and benadryl can be given together IM

## 2018-06-30 NOTE — ED Provider Notes (Signed)
[facility] EMERGENCY DEPARTMENT Provider Note   CSN: [id number] Arrival date & time: 06/30/18  1425     History   Chief Complaint Chief Complaint  Patient presents with  . Headache    HPI Susan Cruz is a 43 y.o. female.  HPI Patient reports she has had a headache for 2 days.  She reports she awakened with it yesterday morning.  Headache has throbbing in quality and pressure quality behind her forehead.  She reports that predominantly hurts her temples bilaterally.  No neck stiffness or pain.  No gait incoordination or dizziness.  No nausea or vomiting.  Patient reports her vision is intermittently slightly blurred.  She reports that she will just have to blink her eyes a couple times to clear her vision.  No double vision or loss of vision.  Patient reports she did try Aleve without relief.  No other associated symptoms.  Patient reports since falling and striking her head several months ago on her nightstand, she does get some intermittent headaches although they typically resolve with an over-the-counter medication.  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  She does not think that her headphones because her pain or pressure. Past Medical History:  Diagnosis Date  . Panic attacks     Patient Active Problem List   Diagnosis Date Noted  . Atypical chest pain 01/13/2017  . Screening for viral disease 01/13/2017    History reviewed. No pertinent surgical history.   OB History   No obstetric history on file.      Home Medications    Prior to Admission medications   Medication Sig Start Date End Date Taking? Authorizing Provider  diphenhydrAMINE (BENADRYL) 25 MG tablet Take 1 tablet (25 mg total) by mouth every 8 (eight) hours as needed. Take with reglan for migraine headache 06/30/18   Charlesetta Shanks, MD  ibuprofen (ADVIL,MOTRIN) 200 MG tablet Take 400 mg by mouth every 6 (six) hours as needed for pain,  fever or headache.    [provider]  ibuprofen (ADVIL,MOTRIN) 600 MG tablet Take 1 tablet (600 mg total) by mouth every 8 (eight) hours as needed. Take with benadryl and reglan for head ache 06/30/18   Charlesetta Shanks, MD  metoCLOPramide (REGLAN) 10 MG tablet Take 1 tablet (10 mg total) by mouth every 8 (eight) hours as needed for nausea (nausea/headache). 06/30/18   Charlesetta Shanks, MD  naproxen (NAPROSYN) 500 MG tablet Take 1 tablet (500 mg total) by mouth 2 (two) times daily. 03/15/18   Orvan July, NP    Family History Family History  Problem Relation Age of Onset  . Anuerysm Mother   . Stroke Father     Social History Social History   Tobacco Use  . Smoking status: Never Smoker  . Smokeless tobacco: Never Used  Substance Use Topics  . Alcohol use: Yes    Comment: social  . Drug use: No     Allergies   Stadol [butorphanol]   Review of Systems Review of Systems 10 Systems reviewed and are negative for acute change except as noted in the HPI.   Physical Exam Updated Vital Signs BP 108/90   Pulse 72   Temp 98.6 F (37 C) (Oral)   Resp 16   Ht 5\' 3"  (1.6 m)   Wt 53.1 kg   LMP 05/31/2018   SpO2 100%   BMI 20.73 kg/m   Physical Exam Constitutional:  Appearance: She is well-developed.     Comments: Patient is clinically well in appearance.  She is alert and nontoxic.  Clear mental status.  HENT:     Head: Normocephalic and atraumatic.     Comments: Bilateral TMs normal.  Dentition excellent condition.  Posterior airway widely patent.  Nares patent.  Patient does have reproducible tenderness to palpation over both temples.  She finds is very uncomfortable.  No percussion tenderness over the brows.  No facial swelling. Eyes:     Pupils: Pupils are equal, round, and reactive to light.  Neck:     Musculoskeletal: Neck supple.     Comments: Neck is supple and nontender.  No anterior soft tissue swelling. Cardiovascular:     Rate and Rhythm: Normal  rate and regular rhythm.     Heart sounds: Normal heart sounds.  Pulmonary:     Effort: Pulmonary effort is normal.     Breath sounds: Normal breath sounds.  Abdominal:     General: Bowel sounds are normal. There is no distension.     Palpations: Abdomen is soft.     Tenderness: There is no abdominal tenderness.  Musculoskeletal: Normal range of motion.  Skin:    General: Skin is warm and dry.  Neurological:     Mental Status: She is alert and oriented to person, place, and time.     GCS: GCS eye subscore is 4. GCS verbal subscore is 5. GCS motor subscore is 6.     Coordination: Coordination normal.     Comments: Patient has normal cognitive function.  Alert and oriented x3.  Speech is clear.  Cranial nerves intact.  No nystagmus.  Pupils symmetric and responsive to light.  Normal finger-nose exam bilaterally.  Normal motor strength upper and lower extremities.  Psychiatric:        Mood and Affect: Mood normal.      ED Treatments / Results  Labs (all labs ordered are listed, but only abnormal results are displayed) Labs Reviewed - No data to display  EKG None  Radiology No results found.  Procedures Procedures (including critical care time)  Medications Ordered in ED Medications  ketorolac (TORADOL) injection 60 mg (60 mg Intramuscular Given 06/30/18 1544)  metoCLOPramide (REGLAN) injection 10 mg (10 mg Intramuscular Given 06/30/18 1542)  diphenhydrAMINE (BENADRYL) injection 25 mg (25 mg Intramuscular Given 06/30/18 1540)     Initial Impression / Assessment and Plan / ED Course  I have reviewed the triage vital signs and the nursing notes.  Pertinent labs & imaging results that were available during my care of the patient were reviewed by me and considered in my medical decision making (see chart for details).    Patient awakened yesterday in the morning with frontal pressure headache throbbing in quality.  No associated neurologic dysfunction.  Patient has  reproducible discomfort to palpation bilateral temples.  Patient's mother reportedly had aneurysm that required surgical intervention.  No other apparent risk factors for aneurysm or dissection.  Patient does not have hypertension, otherwise healthy with history atypical for aneurysm or subarachnoid hemorrhage.  No signs of infectious etiology.  With bitemporal discomfort to palpation I suspect tension headache.  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  Patient also has suffered with intermittent headaches since striking her head several months ago.  She may have postconcussive migraine-like headaches.  Will treat with Benadryl, Toradol and Reglan.  Final Clinical Impressions(s) / ED Diagnoses   Final diagnoses:  Episodic tension-type headache, not intractable    ED Discharge Orders         Ordered    Ambulatory referral to Neurology    Comments:  An appointment is requested in approximately: 2 weeks   06/30/18 1524    ibuprofen (ADVIL,MOTRIN) 600 MG tablet  Every 8 hours PRN     06/30/18 1639    metoCLOPramide (REGLAN) 10 MG tablet  Every 8 hours PRN     06/30/18 1639    diphenhydrAMINE (BENADRYL) 25 MG tablet  Every 8 hours PRN     06/30/18 1639           Charlesetta Shanks, MD 06/30/18 1640

## 2018-06-30 NOTE — ED Triage Notes (Signed)
Pt reports head injury within last three months and headache with intermittent blurred vision starting 2 days ago.  Pt reports taking aleve at home with no relief.

## 2018-08-17 ENCOUNTER — Telehealth: Payer: Self-pay | Admitting: Neurology

## 2018-08-17 NOTE — Telephone Encounter (Signed)
error 

## 2018-08-18 ENCOUNTER — Ambulatory Visit: Payer: Medicaid Other | Admitting: Neurology

## 2018-08-18 ENCOUNTER — Telehealth: Payer: Self-pay | Admitting: *Deleted

## 2018-08-18 NOTE — Telephone Encounter (Signed)
No showed new patient appointment. 

## 2019-07-19 IMAGING — CT CT HEAD W/O CM
4 series · 15 of 47 positions shown, 17 images · non-contrast
Comparison: None.

CLINICAL DATA: Tripped and fall striking the head on a night stand.
Loss of consciousness. Headache since the fall.

EXAM:
CT HEAD WITHOUT CONTRAST
TECHNIQUE: Contiguous axial images were obtained from the base of the skull
through the vertex without intravenous contrast.

[Series 3: head wo · axial · 0.41mm/px · z∈[-128,-33]mm · 7 of 27 slices shown, 9 images]
[im 4/27  brain]
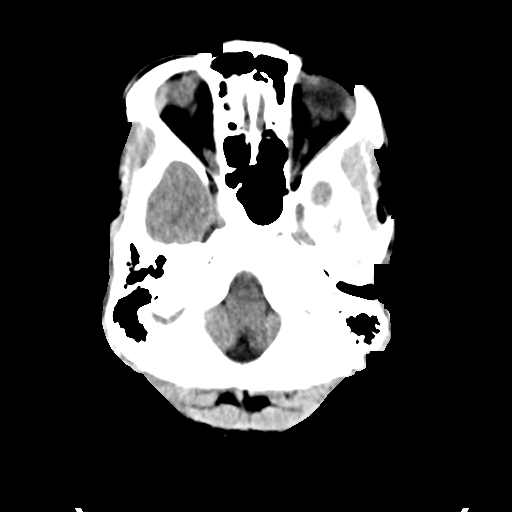
[im 4/27  bone]
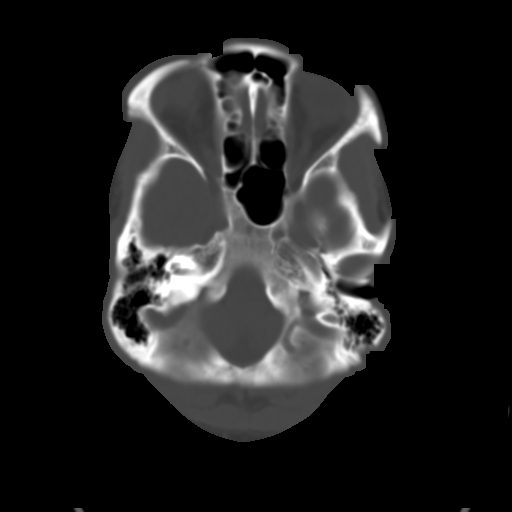
[im 7/27  brain]
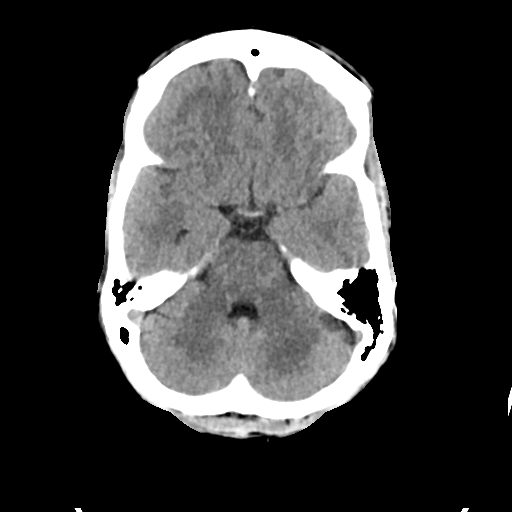
[im 10/27  brain]
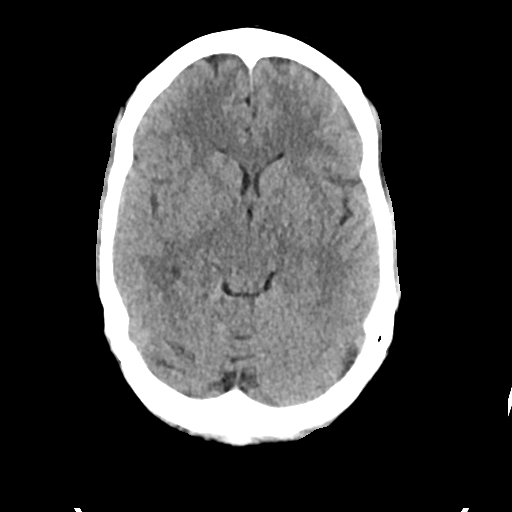
[im 14/27  brain]
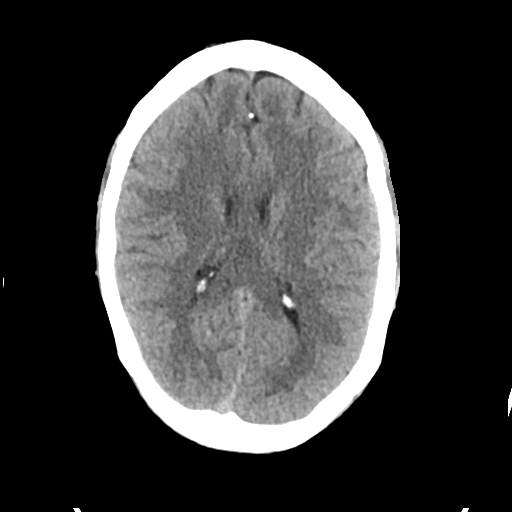
[im 17/27  brain]
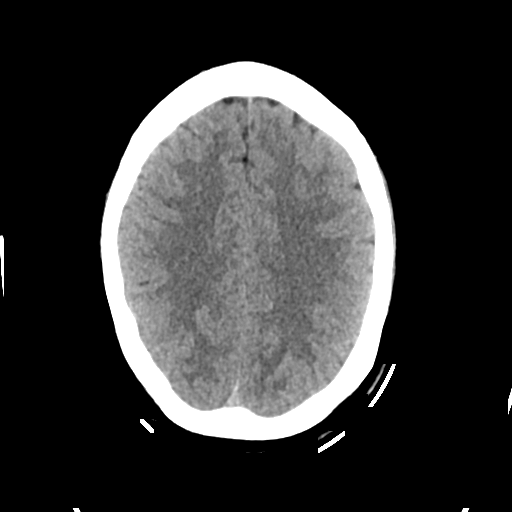
[im 17/27  bone]
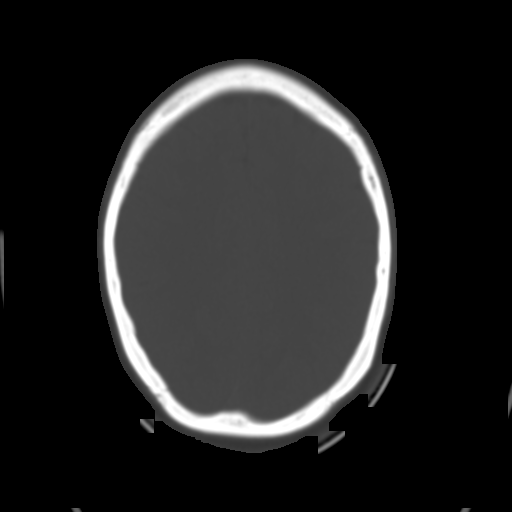
[im 20/27  brain]
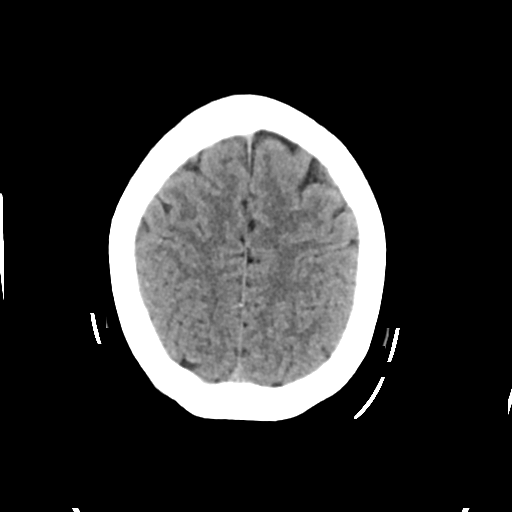
[im 23/27  brain]
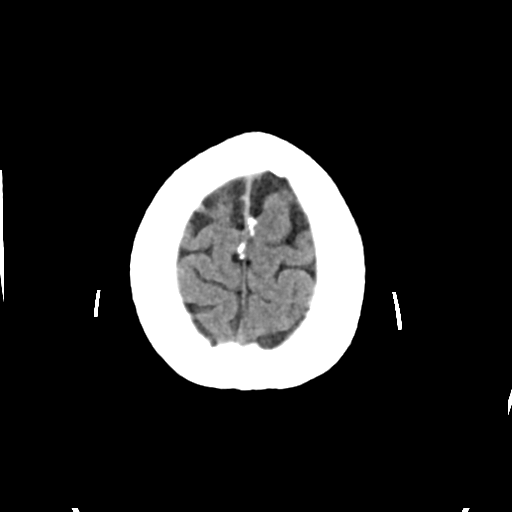

[Series 4: head bone · axial · 0.41mm/px · z∈[-131,-117]mm · 2 of 67 slices shown]
[im 7/67  bone]
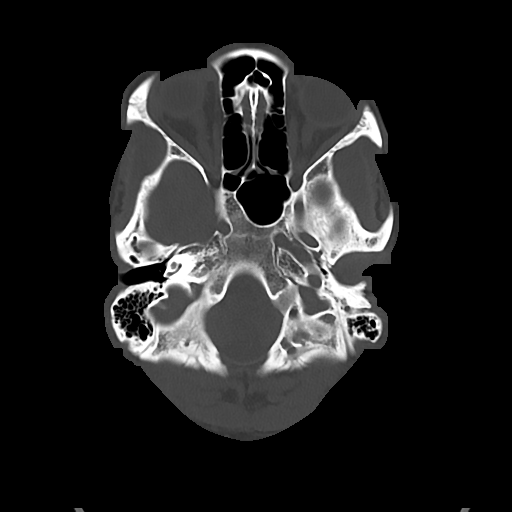
[im 14/67  bone]
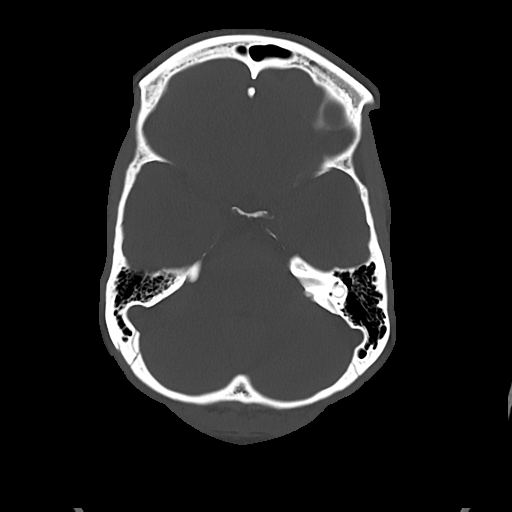

[Series 5: cor soft · coronal · 0.29mm/px · 3 of 60 slices shown]
[im 20/60  brain]
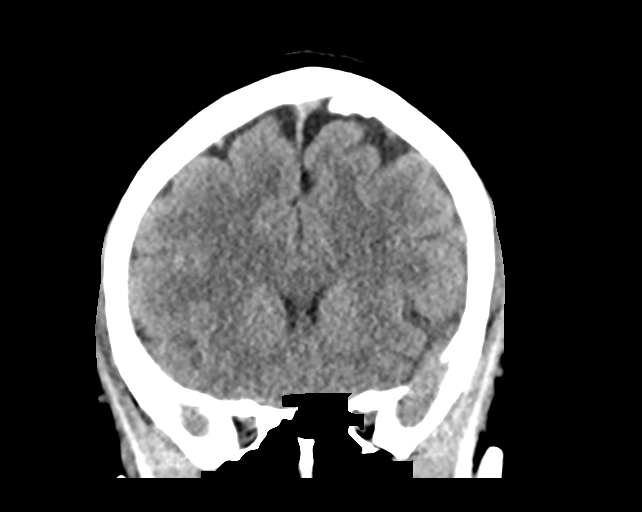
[im 27/60  brain]
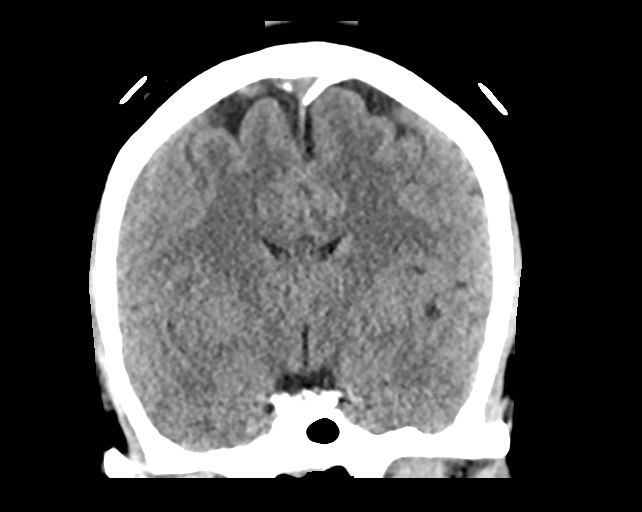
[im 33/60  brain]
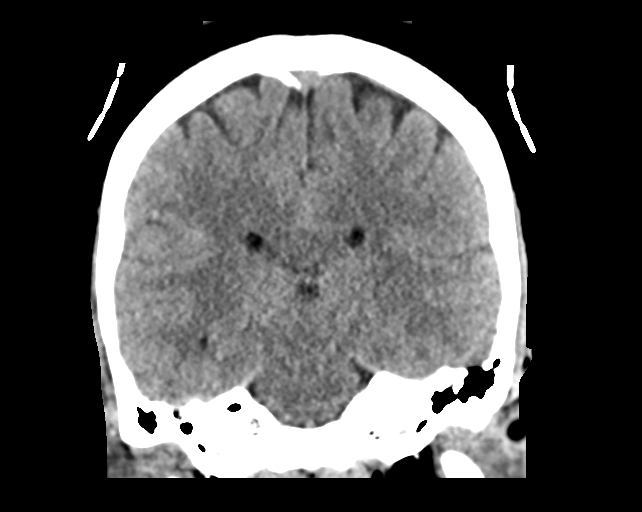

[Series 6: sag soft · sagittal · 0.29mm/px · 3 of 45 slices shown]
[im 15/45  brain]
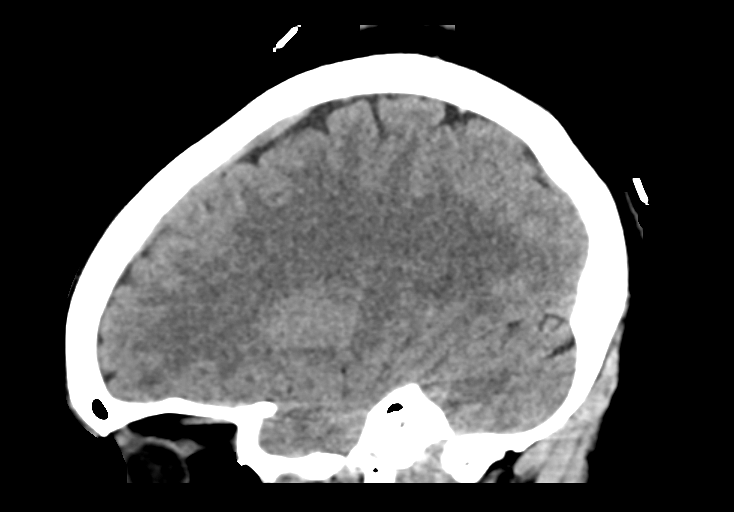
[im 23/45  brain]
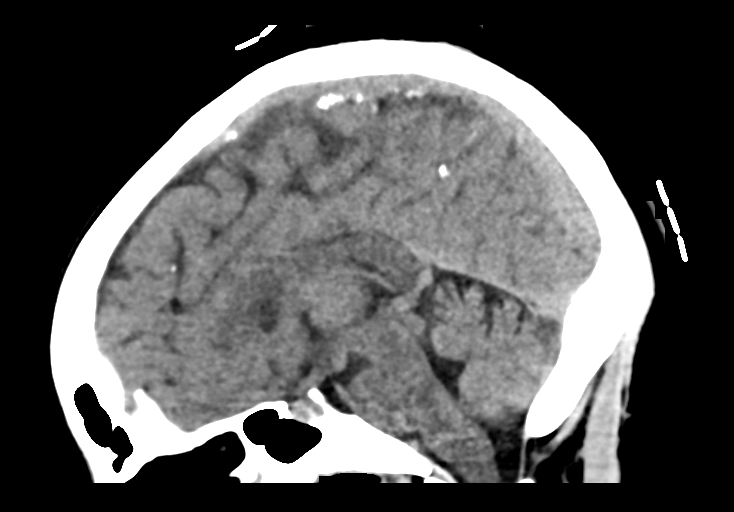
[im 30/45  brain]
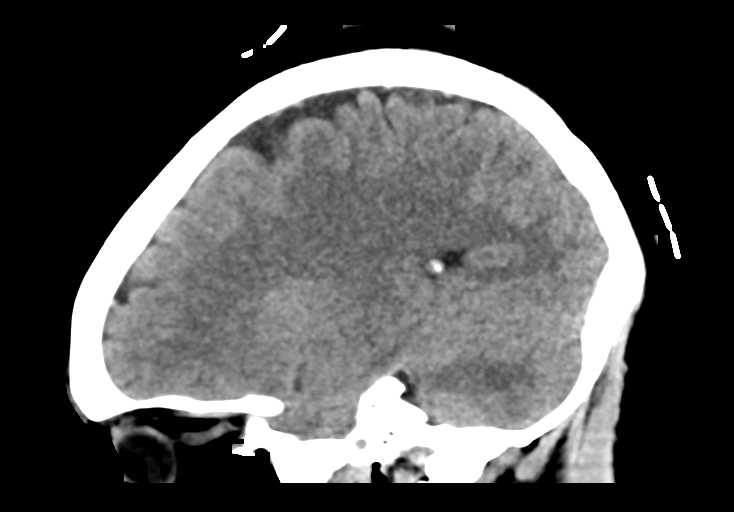

[15 of 47 positions shown; findings below may reference images not displayed]

FINDINGS: Brain: The brainstem, cerebellum, cerebral peduncles, thalami, basal
ganglia, basilar cisterns, and ventricular system appear within
normal limits. No intracranial hemorrhage or acute CVA. No mass
lesion in the brain. Calcification along the left frontal dural
margin on image 54/4 could reflect a small incidental meningioma.

Vascular: Unremarkable

Skull: Unremarkable

Sinuses/Orbits: Unremarkable

Other: No supplemental non-categorized findings.
IMPRESSION: 1. No acute intracranial findings.
2. Fleck calcification on the left frontal dural margin on image
54/4 could possibly reflect a small incidental meningioma.

## 2019-12-21 ENCOUNTER — Ambulatory Visit: Payer: Medicaid Other | Admitting: Advanced Practice Midwife

## 2020-01-10 ENCOUNTER — Ambulatory Visit: Payer: Medicaid Other | Admitting: Advanced Practice Midwife

## 2020-02-08 ENCOUNTER — Ambulatory Visit: Payer: Medicaid Other | Admitting: Advanced Practice Midwife

## 2020-02-27 ENCOUNTER — Ambulatory Visit: Payer: Medicaid Other | Admitting: Advanced Practice Midwife

## 2020-07-25 ENCOUNTER — Ambulatory Visit (INDEPENDENT_AMBULATORY_CARE_PROVIDER_SITE_OTHER): Payer: BC Managed Care – PPO | Admitting: Obstetrics & Gynecology

## 2020-07-25 ENCOUNTER — Other Ambulatory Visit (HOSPITAL_COMMUNITY)
Admission: RE | Admit: 2020-07-25 | Discharge: 2020-07-25 | Disposition: A | Discharge status: Home / Self Care | Payer: BC Managed Care – PPO | Source: Ambulatory Visit | Attending: Obstetrics & Gynecology | Admitting: Obstetrics & Gynecology

## 2020-07-25 ENCOUNTER — Other Ambulatory Visit: Payer: Self-pay

## 2020-07-25 ENCOUNTER — Encounter: Payer: Self-pay | Admitting: Obstetrics & Gynecology

## 2020-07-25 VITALS — BP 121/78 | HR 80 | Ht 63.5 in | Wt 103.0 lb

## 2020-07-25 DIAGNOSIS — N921 Excessive and frequent menstruation with irregular cycle: Secondary | ICD-10-CM | POA: Diagnosis not present

## 2020-07-25 DIAGNOSIS — Z01419 Encounter for gynecological examination (general) (routine) without abnormal findings: Secondary | ICD-10-CM | POA: Diagnosis not present

## 2020-07-25 NOTE — Patient Instructions (Signed)
Abnormal Uterine Bleeding Abnormal uterine bleeding means bleeding more than usual from your womb (uterus). It can include:  Bleeding between menstrual periods.  Bleeding after sex.  Bleeding that is heavier than normal.  Menstrual periods that last longer than usual.  Bleeding after you have stopped having your menstrual period (menopause). There are many problems that may cause this. You should see a doctor for any kind of bleeding that is not normal. Treatment depends on the cause of the bleeding. Follow these instructions at home: Medicines  Take over-the-counter and prescription medicines only as told by your doctor.  Tell your doctor about other medicines that you take. ? If told by your doctor, stop taking aspirin or medicines that have aspirin in them. These medicines can make you bleed more.  You may be given iron pills to replace iron that your body loses because of this condition. Take them as told by your doctor. Managing constipation If you are taking iron pills, you may have trouble pooping (constipation). To prevent or treat trouble pooping, you may need to:  Drink enough fluid to keep your pee (urine) pale yellow.  Take over-the-counter or prescription medicines.  Eat foods that are high in fiber. These include beans, whole grains, and fresh fruits and vegetables.  Limit foods that are high in fat and sugar. These include fried or sweet foods. General instructions  Watch your condition for any changes.  Do not use tampons, douche, or have sex, if your doctor tells you not to.  Change your pads often.  Get regular exams. This includes pelvic exams and cervical cancer screenings. ? It is up to you to get the results of any tests that are done. Ask your doctor, or the department that is doing the tests, when your results will be ready.  Keep all follow-up visits as told by your doctor. This is important. Contact a doctor if:  The bleeding lasts more than 1  week.  You feel dizzy at times.  You feel like you may vomit (nausea).  You vomit.  You feel light-headed or weak.  Your symptoms get worse. Get help right away if:  You pass out.  You have to change pads every hour.  You have pain in your belly.  You have a fever or chills.  You get sweaty.  You get weak.  You pass large blood clots from your vagina. Summary  Abnormal uterine bleeding means bleeding more than usual from your womb (uterus).  Any kind of bleeding that is not normal should be checked by a doctor.  Treatment depends on the cause of the bleeding.  Get help right away if you pass out, you have to change pads every hour, or you pass large blood clots from your vagina. This information is not intended to replace advice given to you by your health care provider. Make sure you discuss any questions you have with your health care provider. Document Revised: 03/22/2019 Document Reviewed: 03/22/2019 Elsevier Patient Education  2021 Elsevier Inc.  

## 2020-07-25 NOTE — Progress Notes (Signed)
New GYN patient presents for Annual Exam.  LMP:07/08/20 -07/19/20 Last pap: > 3 yrs  Mammogram: > 2 yrs  STD Screening: Declines has not been sexually active  No Family Hx of Breast Cancer  CC: irregular period used to be 3 days will now come on twice a month Pt wants to be checked for fibroids.

## 2020-07-25 NOTE — Progress Notes (Signed)
Subjective:     Susan Cruz is a 45 y.o. female and is here for a comprehensive physical exam. The patient reports problems - irregular menses with duration of 5-10 days and menstrual pain . G3P3 Patient's last menstrual period was 07/08/2020 (exact date).  Social History   Socioeconomic History  . Marital status: Legally Separated    Spouse name: Not on file  . Number of children: Not on file  . Years of education: Not on file  . Highest education level: Not on file  Occupational History  . Occupation: Hotel manager support  Tobacco Use  . Smoking status: Never Smoker  . Smokeless tobacco: Never Used  Vaping Use  . Vaping Use: Never used  Substance and Sexual Activity  . Alcohol use: Yes    Comment: social  . Drug use: No  . Sexual activity: Not Currently  Other Topics Concern  . Not on file  Social History Narrative   Work for [employer], Therapist, art. Lives with 45 year older daughter and 27 years old son. Has 3 children the last is currently in college.    Social Determinants of Health   Financial Resource Strain: Not on file  Food Insecurity: Not on file  Transportation Needs: Not on file  Physical Activity: Not on file  Stress: Not on file  Social Connections: Not on file  Intimate Partner Violence: Not on file   Health Maintenance  Topic Date Due  . Hepatitis C Screening  Never done  . COVID-19 Vaccine (1) Never done  . TETANUS/TDAP  Never done  . PAP SMEAR-Modifier  Never done  . INFLUENZA VACCINE  Never done  . HIV Screening  Completed    The following portions of the patient's history were reviewed and updated as appropriate: allergies, current medications, past family history, past medical history, past social history, past surgical history and problem list.  Review of Systems Constitutional: positive for weight loss  Review of Systems  Respiratory: Negative.   Cardiovascular: Negative.   Gastrointestinal: Positive for constipation.   Genitourinary: Negative for dysuria and urgency.  Psychiatric/Behavioral:       Grief reaction after loss of both parents a few months ago    Objective:    BP 121/78   Pulse 80   Ht 5' 3.5" (1.613 m)   Wt 103 lb (46.7 kg)   LMP 07/08/2020 (Exact Date)   BMI 17.96 kg/m  General appearance: alert, cooperative and no distress Head: Normocephalic, without obvious abnormality, atraumatic Neck: no adenopathy, supple, symmetrical, trachea midline and thyroid not enlarged, symmetric, no tenderness/mass/nodules Lungs: clear to auscultation bilaterally Breasts: normal appearance, no masses or tenderness, bilateral fibrocystic changes Heart: regular rate and rhythm, S1, S2 normal, no murmur, click, rub or gallop Abdomen: soft, non-tender; bowel sounds normal; no masses,  no organomegaly Pelvic: cervix normal in appearance, external genitalia normal, no adnexal masses or tenderness, no cervical motion tenderness and vagina normal without discharge uterus firm irregular 6-8 week size Extremities: extremities normal, atraumatic, no cyanosis or edema Skin: Skin color, texture, turgor normal. No rashes or lesions    Assessment:    Healthy female exam. Well woman exam with routine gynecological exam - Plan: Cytology - PAP( [place]), MM DIGITAL SCREENING BILATERAL  Menometrorrhagia - Plan: Cytology - PAP( [place]), US PELVIC COMPLETE WITH TRANSVAGINAL        Plan:      Orders Placed This Encounter  Procedures  . US PELVIC COMPLETE WITH TRANSVAGINAL    Standing  Status:   Future    Standing Expiration Date:   07/25/2021    Order Specific Question:   Reason for Exam (SYMPTOM  OR DIAGNOSIS REQUIRED)    Answer:   AUB    Order Specific Question:   Preferred imaging location?    Answer:   [facility]    Order Specific Question:   Release to patient    Answer:   Immediate  . MM DIGITAL SCREENING BILATERAL    Standing Status:   Future    Standing Expiration Date:   11/22/2020     Order Specific Question:   Reason for exam:    Answer:   Screening    Order Specific Question:   Is the patient pregnant?    Answer:   No    Order Specific Question:   Preferred imaging location?    Answer:   [facility]   mammogram See After Visit Summary for Counseling Recommendations    30 min face to face and coordination of care  Woodroe Mode, MD 07/25/2020

## 2020-07-27 LAB — CYTOLOGY - PAP
Comment: NEGATIVE
Diagnosis: NEGATIVE
Diagnosis: REACTIVE
High risk HPV: NEGATIVE

## 2020-07-30 NOTE — Progress Notes (Signed)
Negative pap and HPV, repeat in 3-5 years

## 2020-07-31 ENCOUNTER — Ambulatory Visit: Admission: RE | Admit: 2020-07-31 | Payer: BC Managed Care – PPO | Source: Ambulatory Visit

## 2020-08-09 ENCOUNTER — Inpatient Hospital Stay: Admission: RE | Admit: 2020-08-09 | Payer: BC Managed Care – PPO | Source: Ambulatory Visit

## 2021-08-12 ENCOUNTER — Ambulatory Visit: Payer: BC Managed Care – PPO | Admitting: Obstetrics and Gynecology

## 2021-08-12 ENCOUNTER — Encounter: Payer: Self-pay | Admitting: Obstetrics and Gynecology

## 2021-08-13 NOTE — Progress Notes (Signed)
Patient did not keep her GYN annual exam appointment for 08/12/2021. ? ?Durene Romans MD ?Attending ?[facility] [employer] [facility]) ? ?

## 2021-10-01 ENCOUNTER — Ambulatory Visit (INDEPENDENT_AMBULATORY_CARE_PROVIDER_SITE_OTHER): Payer: Medicaid Other | Admitting: Obstetrics

## 2021-10-01 ENCOUNTER — Other Ambulatory Visit (HOSPITAL_COMMUNITY)
Admission: RE | Admit: 2021-10-01 | Discharge: 2021-10-01 | Disposition: A | Discharge status: Home / Self Care | Payer: Medicaid Other | Source: Ambulatory Visit | Attending: Obstetrics and Gynecology | Admitting: Obstetrics and Gynecology

## 2021-10-01 ENCOUNTER — Encounter: Payer: Self-pay | Admitting: Obstetrics

## 2021-10-01 VITALS — BP 143/75 | HR 56 | Ht 63.5 in | Wt 108.0 lb

## 2021-10-01 DIAGNOSIS — N898 Other specified noninflammatory disorders of vagina: Secondary | ICD-10-CM | POA: Insufficient documentation

## 2021-10-01 DIAGNOSIS — Z01411 Encounter for gynecological examination (general) (routine) with abnormal findings: Secondary | ICD-10-CM

## 2021-10-01 DIAGNOSIS — Z113 Encounter for screening for infections with a predominantly sexual mode of transmission: Secondary | ICD-10-CM

## 2021-10-01 DIAGNOSIS — Z01419 Encounter for gynecological examination (general) (routine) without abnormal findings: Secondary | ICD-10-CM

## 2021-10-01 DIAGNOSIS — N926 Irregular menstruation, unspecified: Secondary | ICD-10-CM | POA: Diagnosis not present

## 2021-10-01 DIAGNOSIS — Z1239 Encounter for other screening for malignant neoplasm of breast: Secondary | ICD-10-CM

## 2021-10-01 DIAGNOSIS — Z0000 Encounter for general adult medical examination without abnormal findings: Secondary | ICD-10-CM

## 2021-10-01 DIAGNOSIS — Z30013 Encounter for initial prescription of injectable contraceptive: Secondary | ICD-10-CM | POA: Diagnosis not present

## 2021-10-01 DIAGNOSIS — Z0001 Encounter for general adult medical examination with abnormal findings: Secondary | ICD-10-CM

## 2021-10-01 LAB — POCT URINALYSIS DIPSTICK
Bilirubin, UA: NEGATIVE
Blood, UA: NEGATIVE
Glucose, UA: NEGATIVE
Ketones, UA: NEGATIVE
Leukocytes, UA: NEGATIVE
Nitrite, UA: NEGATIVE
Protein, UA: NEGATIVE
Spec Grav, UA: 1.015 (ref 1.010–1.025)
Urobilinogen, UA: 0.2 E.U./dL
pH, UA: 5 (ref 5.0–8.0)

## 2021-10-01 LAB — POCT URINE PREGNANCY: Preg Test, Ur: NEGATIVE

## 2021-10-01 MED ORDER — MEDROXYPROGESTERONE ACETATE 150 MG/ML IM SUSP
150.0000 mg | Freq: Once | INTRAMUSCULAR | Status: AC
  Administered 2021-10-01: 150 mg via INTRAMUSCULAR

## 2021-10-01 MED ORDER — MEDROXYPROGESTERONE ACETATE 150 MG/ML IM SUSP: 150.0000 mg | Freq: Once | INTRAMUSCULAR | Status: DC

## 2021-10-01 MED ORDER — MEDROXYPROGESTERONE ACETATE 150 MG/ML IM SUSP: 150.0000 mg | INTRAMUSCULAR | 4 refills | Status: DC

## 2021-10-01 NOTE — Addendum Note (Signed)
Addended by: Penny Pia on: 10/01/2021 01:04 PM ? ? Modules accepted: Orders ? ?

## 2021-10-01 NOTE — Progress Notes (Signed)
Request assistance with irreg cycles. ?Request assistance with weight gain. ?Reports weight loss when parents died in the same week last year. ?

## 2021-10-01 NOTE — Progress Notes (Signed)
? ?Subjective: ? ? ?  ?  ? Susan Cruz is a 46 y.o. female here for a routine exam.  Current complaints: Irregular periods.  Vaginal discharge.  She is really concerned about her weight.  She states that her weight was " normal " until the death of her patrents at ~ the same time last year.  She lost weight rapidly during her grief, and has not been able to gain significant weight, despite caloric supplementation with diet and Ensure.  ? ?Personal health questionnaire:  ?Is patient Ashkenazi Jewish, have a family history of breast and/or ovarian cancer: no ?Is there a family history of uterine cancer diagnosed at age < 4, gastrointestinal cancer, urinary tract cancer, family member who is a Field seismologist syndrome-associated carrier: no ?Is the patient overweight and hypertensive, family history of diabetes, personal history of gestational diabetes, preeclampsia or PCOS: no ?Is patient over 72, have PCOS,  family history of premature CHD under age 36, diabetes, smoke, have hypertension or peripheral artery disease:  no ?At any time, has a partner hit, kicked or otherwise hurt or frightened you?: no ?Over the past 2 weeks, have you felt down, depressed or hopeless?: no ?Over the past 2 weeks, have you felt little interest or pleasure in doing things?:no ? ? ?Gynecologic History ?Patient's last menstrual period was 09/08/2021. ?Contraception: tubal ligation ?Last Pap: 07-25-2020. Results were: normal ?Last mammogram: unknown. Results were: unknown ? ? ?Obstetric History ?OB History  ?Gravida Para Term Preterm AB Living  ?'3 3 2 1   3  '$ ?SAB IAB Ectopic Multiple Live Births  ?           ?  ?# Outcome Date GA Lbr Len/2nd Weight Sex Delivery Anes PTL Lv  ?3 Term           ?2 Term           ?1 Preterm           ? ? ?Past Medical History:  ?Diagnosis Date  ? Panic attacks   ?  ?Past Surgical History:  ?Procedure Laterality Date  ? NO PAST SURGERIES    ?  ? ?Current Outpatient Medications:  ?  medroxyPROGESTERone  (DEPO-PROVERA) 150 MG/ML injection, Inject 1 mL (150 mg total) into the muscle every 3 (three) months., Disp: 1 mL, Rfl: 4 ?  diphenhydrAMINE (BENADRYL) 25 MG tablet, Take 1 tablet (25 mg total) by mouth every 8 (eight) hours as needed. Take with reglan for migraine headache, Disp: 20 tablet, Rfl: 0 ?  ibuprofen (ADVIL,MOTRIN) 200 MG tablet, Take 400 mg by mouth every 6 (six) hours as needed for pain, fever or headache., Disp: , Rfl:  ?  ibuprofen (ADVIL,MOTRIN) 600 MG tablet, Take 1 tablet (600 mg total) by mouth every 8 (eight) hours as needed. Take with benadryl and reglan for head ache, Disp: 30 tablet, Rfl: 0 ?  metoCLOPramide (REGLAN) 10 MG tablet, Take 1 tablet (10 mg total) by mouth every 8 (eight) hours as needed for nausea (nausea/headache)., Disp: 20 tablet, Rfl: 0 ?  naproxen (NAPROSYN) 500 MG tablet, Take 1 tablet (500 mg total) by mouth 2 (two) times daily., Disp: 30 tablet, Rfl: 0 ? ?Current Facility-Administered Medications:  ?  medroxyPROGESTERone (DEPO-PROVERA) injection 150 mg, 150 mg, Intramuscular, Once, Shelly Bombard, MD ?Allergies  ?Allergen Reactions  ? Stadol [Butorphanol] Itching  ?  ?Social History  ? ?Tobacco Use  ? Smoking status: Never  ? Smokeless tobacco: Never  ?Substance Use Topics  ? Alcohol  use: Yes  ?  Comment: social  ?  ?Family History  ?Problem Relation Age of Onset  ? Alzheimer's disease Maternal Grandmother   ? Stroke Father   ? Anuerysm Mother   ?  ? ? ?Review of Systems ? ?Constitutional: negative for fatigue and weight loss ?Respiratory: negative for cough and wheezing ?Cardiovascular: negative for chest pain, fatigue and palpitations ?Gastrointestinal: negative for abdominal pain and change in bowel habits ?Musculoskeletal:negative for myalgias ?Neurological: negative for gait problems and tremors ?Behavioral/Psych: negative for abusive relationship, depression ?Endocrine: negative for temperature intolerance    ?Genitourinary:positive for irregular menstrual periods  and vaginal discharge.  , genital lesions, hot flashes, sexual problems  ?Integument/breast: negative for breast lump, breast tenderness, nipple discharge and skin lesion(s) ? ?  ?Objective:  ? ?    ?Ht 5' 3.5" (1.613 m)   Wt 108 lb (49 kg)   LMP 09/08/2021 Comment: Bled 4/9-4/14, small amt bleeding again 4/23  BMI 18.83 kg/m?  ?General:   Alert and no distress  ?Skin:   no rash or abnormalities  ?Lungs:   clear to auscultation bilaterally  ?Heart:   regular rate and rhythm, S1, S2 normal, no murmur, click, rub or gallop  ?Breasts:   normal without suspicious masses, skin or nipple changes or axillary nodes  ?Abdomen:  normal findings: no organomegaly, soft, non-tender and no hernia  ?Pelvis:  External genitalia: normal general appearance ?Urinary system: urethral meatus normal and bladder without fullness, nontender ?Vaginal: normal without tenderness, induration or masses ?Cervix: normal appearance ?Adnexa: normal bimanual exam ?Uterus: anteverted and non-tender, normal size  ? ?Lab Review ?Urine pregnancy test ?Labs reviewed yes ?Radiologic studies reviewed yes ? ?I have spent a total of 20 minutes of face-to-face time, excluding clinical staff time, reviewing notes and preparing to see patient, ordering tests and/or medications, and counseling the patient.  ? ?Assessment:  ? ? 1. Encounter for gynecological examination with Papanicolaou smear of cervix ?Rx: ?- POCT Urinalysis Dipstick ?- Cytology - PAP( [place]) ? ?2. Irregular periods/menstrual cycles ?- she has had a tubal ligation, but wants touse Depo to stop her irregular cycles, and to help gain weight ?- CBC ?- TSH ?- Comprehensive metabolic panel ?- medroxyPROGESTERone (DEPO-PROVERA) injection 150 mg ?- medroxyPROGESTERone (DEPO-PROVERA) 150 MG/ML injection; Inject 1 mL (150 mg total) into the muscle every 3 (three) months.  Dispense: 1 mL; Refill: 4 ? ?3. Vaginal discharge ?Rx: ?- Cervicovaginal ancillary only( [place]) ? ?4. Screening for  STD (sexually transmitted disease) ?Rx: ?- RPR ?- HIV antibody (with reflex) ?- Urine Culture ? ?5. Encounter for initial prescription of injectable contraceptive for AUB and help with needed weight gain ?Rx: ?- medroxyPROGESTERone (DEPO-PROVERA) injection 150 mg ?- medroxyPROGESTERone (DEPO-PROVERA) 150 MG/ML injection; Inject 1 mL (150 mg total) into the muscle every 3 (three) months.  Dispense: 1 mL; Refill: 4 ?- POCT urine pregnancy ? ?6. Screening breast examination ?Rx: ?- MM 3D SCREEN BREAST BILATERAL; Future ? ?7. Routine adult health maintenance ?Rx: ?- Ambulatory referral to Internal Medicine  ?  ? ?Plan:  ? ? Education reviewed: calcium supplements, depression evaluation, low fat, low cholesterol diet, safe sex/STD prevention, self breast exams, and weight bearing exercise. ?Mammogram ordered. ?Follow up in: 3 months.  Assess cycle control and weight ? ?Meds ordered this encounter  ?Medications  ? medroxyPROGESTERone (DEPO-PROVERA) injection 150 mg  ? medroxyPROGESTERone (DEPO-PROVERA) 150 MG/ML injection  ?  Sig: Inject 1 mL (150 mg total) into the muscle every 3 (three) months.  ?  Dispense:  1 mL  ?  Refill:  4  ? medroxyPROGESTERone (DEPO-PROVERA) injection 150 mg  ? ?Orders Placed This Encounter  ?Procedures  ? Urine Culture  ? MM 3D SCREEN BREAST BILATERAL  ?  Standing Status:   Future  ?  Standing Expiration Date:   10/02/2022  ?  Order Specific Question:   Reason for Exam (SYMPTOM  OR DIAGNOSIS REQUIRED)  ?  Answer:   Screening  ?  Order Specific Question:   Is the patient pregnant?  ?  Answer:   No  ?  Order Specific Question:   Preferred imaging location?  ?  Answer:   [facility]  ? RPR  ? HIV antibody (with reflex)  ? CBC  ? TSH  ? Comprehensive metabolic panel  ? Ambulatory referral to Internal Medicine  ?  Referral Priority:   Routine  ?  Referral Type:   Consultation  ?  Referral Reason:   Specialty Services Required  ?  Requested Specialty:   Internal Medicine  ?  Number of Visits  Requested:   1  ? POCT Urinalysis Dipstick  ? POCT urine pregnancy  ? ? ? ? Shelly Bombard, MD ?10/01/2021 12:58 PM  ?

## 2021-10-02 ENCOUNTER — Other Ambulatory Visit: Payer: Self-pay | Admitting: Obstetrics

## 2021-10-02 DIAGNOSIS — D649 Anemia, unspecified: Secondary | ICD-10-CM

## 2021-10-02 DIAGNOSIS — N926 Irregular menstruation, unspecified: Secondary | ICD-10-CM

## 2021-10-02 LAB — COMPREHENSIVE METABOLIC PANEL
ALT: 8 IU/L (ref 0–32)
AST: 19 IU/L (ref 0–40)
Albumin/Globulin Ratio: 1.6 (ref 1.2–2.2)
Albumin: 4.8 g/dL (ref 3.8–4.8)
Alkaline Phosphatase: 44 IU/L (ref 44–121)
BUN/Creatinine Ratio: 16 (ref 9–23)
BUN: 14 mg/dL (ref 6–24)
Bilirubin Total: 0.3 mg/dL (ref 0.0–1.2)
CO2: 22 mmol/L (ref 20–29)
Calcium: 9.3 mg/dL (ref 8.7–10.2)
Chloride: 103 mmol/L (ref 96–106)
Creatinine, Ser: 0.85 mg/dL (ref 0.57–1.00)
Globulin, Total: 3 g/dL (ref 1.5–4.5)
Glucose: 87 mg/dL (ref 70–99)
Potassium: 4.1 mmol/L (ref 3.5–5.2)
Sodium: 139 mmol/L (ref 134–144)
Total Protein: 7.8 g/dL (ref 6.0–8.5)
eGFR: 87 mL/min/{1.73_m2} (ref >59)

## 2021-10-02 LAB — CBC
Hematocrit: 28.9 % — ABNORMAL LOW (ref 34.0–46.6)
Hemoglobin: 7.5 g/dL — ABNORMAL LOW (ref 11.1–15.9)
MCH: 17.1 pg — ABNORMAL LOW (ref 26.6–33.0)
MCHC: 26 g/dL — ABNORMAL LOW (ref 31.5–35.7)
MCV: 66 fL — ABNORMAL LOW (ref 79–97)
Platelets: 383 10*3/uL (ref 150–450)
RBC: 4.38 x10E6/uL (ref 3.77–5.28)
RDW: 21.8 % — ABNORMAL HIGH (ref 11.7–15.4)
WBC: 6.4 10*3/uL (ref 3.4–10.8)

## 2021-10-02 LAB — CERVICOVAGINAL ANCILLARY ONLY
Bacterial Vaginitis (gardnerella): POSITIVE — AB
Candida Glabrata: NEGATIVE
Candida Vaginitis: POSITIVE — AB
Chlamydia: NEGATIVE
Comment: NEGATIVE
Comment: NEGATIVE
Comment: NEGATIVE
Comment: NEGATIVE
Comment: NEGATIVE
Comment: NORMAL
Neisseria Gonorrhea: NEGATIVE
Trichomonas: NEGATIVE

## 2021-10-02 LAB — HIV ANTIBODY (ROUTINE TESTING W REFLEX): HIV Screen 4th Generation wRfx: NONREACTIVE

## 2021-10-02 LAB — TSH: TSH: 1.2 u[IU]/mL (ref 0.450–4.500)

## 2021-10-02 LAB — RPR: RPR Ser Ql: NONREACTIVE

## 2021-10-02 MED ORDER — IRON POLYSACCH CMPLX-B12-FA 150-0.025-1 MG PO CAPS: 1.0000 | ORAL_CAPSULE | ORAL | 5 refills | Status: DC

## 2021-10-02 MED ORDER — PNV-DHA+DOCUSATE 27-1.25-300 MG PO CAPS: 1.0000 | ORAL_CAPSULE | Freq: Every day | ORAL | 4 refills | Status: DC

## 2021-10-03 LAB — SPECIMEN STATUS REPORT

## 2021-10-03 LAB — CYTOLOGY - PAP
Comment: NEGATIVE
Diagnosis: NEGATIVE
High risk HPV: NEGATIVE

## 2021-10-03 LAB — FERRITIN: Ferritin: 6 ng/mL — ABNORMAL LOW (ref 15–150)

## 2021-10-03 LAB — URINE CULTURE

## 2021-10-05 ENCOUNTER — Other Ambulatory Visit: Payer: Self-pay | Admitting: Obstetrics

## 2021-10-05 DIAGNOSIS — N760 Acute vaginitis: Secondary | ICD-10-CM

## 2021-10-05 MED ORDER — FLUCONAZOLE 150 MG PO TABS: 150.0000 mg | ORAL_TABLET | Freq: Once | ORAL | 0 refills | Status: AC

## 2021-10-05 MED ORDER — METRONIDAZOLE 500 MG PO TABS: 500.0000 mg | ORAL_TABLET | Freq: Two times a day (BID) | ORAL | 2 refills | Status: DC

## 2021-10-07 ENCOUNTER — Telehealth: Payer: Self-pay

## 2021-10-07 NOTE — Telephone Encounter (Signed)
S/w pt and advised of results and rx 

## 2021-10-15 ENCOUNTER — Other Ambulatory Visit: Payer: Self-pay

## 2021-10-15 DIAGNOSIS — Z1231 Encounter for screening mammogram for malignant neoplasm of breast: Secondary | ICD-10-CM

## 2021-11-22 ENCOUNTER — Telehealth: Payer: Self-pay | Admitting: Emergency Medicine

## 2021-12-26 ENCOUNTER — Encounter: Payer: Self-pay | Admitting: Family Medicine

## 2021-12-26 ENCOUNTER — Ambulatory Visit (INDEPENDENT_AMBULATORY_CARE_PROVIDER_SITE_OTHER): Payer: Self-pay | Admitting: Family Medicine

## 2021-12-26 ENCOUNTER — Ambulatory Visit: Payer: BC Managed Care – PPO

## 2021-12-26 VITALS — BP 138/83 | HR 68 | Wt 109.0 lb

## 2021-12-26 DIAGNOSIS — N939 Abnormal uterine and vaginal bleeding, unspecified: Secondary | ICD-10-CM

## 2021-12-26 MED ORDER — NORETHIN ACE-ETH ESTRAD-FE 1-20 MG-MCG(24) PO TABS: 1.0000 | ORAL_TABLET | Freq: Every day | ORAL | 3 refills | Status: DC

## 2021-12-26 NOTE — Progress Notes (Signed)
   Subjective:    Patient ID: Susan Cruz, female    DOB: [birth year], 46 y.o.   MRN: [id number]  HPI  Patient seen for follow-up of heavy menstrual bleeding.  Patient was given dose of Depo-Provera last appointment.  Since her Depo shot, the patient has had continuous bleeding.  There would be some days where it appeared that her menses were lightening up, but then it would become heavy the next day.  She would like to be on COC's and requested a prescription of Dasetta.  No chronic migraines.  No family history of blood clots in the legs or lungs.  No personal history of DVTs.  Review of Systems     Objective:   Physical Exam Vitals reviewed.  Constitutional:      Appearance: Normal appearance.  Cardiovascular:     Rate and Rhythm: Normal rate and regular rhythm.  Pulmonary:     Effort: Pulmonary effort is normal.  Abdominal:     General: Abdomen is flat. There is no distension.     Palpations: Abdomen is soft. There is no mass.     Tenderness: There is no abdominal tenderness.     Hernia: No hernia is present.  Skin:    General: Skin is warm and dry.     Capillary Refill: Capillary refill takes less than 2 seconds.  Neurological:     General: No focal deficit present.     Mental Status: She is alert.  Psychiatric:        Mood and Affect: Mood normal.        Behavior: Behavior normal.        Thought Content: Thought content normal.       Assessment & Plan:  1. Abnormal uterine bleeding (AUB) The patient has not had an ultrasound.  Will obtain ultrasound to evaluate for fibroid uterus or other problems leading to heavy menstrual bleeding.  If remains unresponsive to hormones or if has very thick endometrium, may need endometrial biopsy. We discussed giving second dose of Depo to see her bleeding would be captured.  Patient does not want to trial this.  I discussed the risk of being on Dasetta, mainly the DVT risk.  A lower estrogen dose would be safer.  We will start  on Loestrin and follow-up in 3 months. - US PELVIS TRANSVAGINAL NON-OB (TV ONLY); Future

## 2021-12-26 NOTE — Progress Notes (Signed)
Pt presents to switch to OCP "Dasetta." She is experiencing menorrhagia no relief with Depo. Pt started Depo in May. Pt requests a pill to help stop the VB.  Normal pap 10/01/2021

## 2021-12-31 ENCOUNTER — Ambulatory Visit: Payer: BC Managed Care – PPO

## 2022-02-04 ENCOUNTER — Ambulatory Visit (HOSPITAL_BASED_OUTPATIENT_CLINIC_OR_DEPARTMENT_OTHER): Admission: RE | Admit: 2022-02-04 | Payer: Medicaid Other | Source: Ambulatory Visit

## 2022-03-06 ENCOUNTER — Ambulatory Visit (HOSPITAL_BASED_OUTPATIENT_CLINIC_OR_DEPARTMENT_OTHER): Payer: Medicaid Other

## 2022-03-26 ENCOUNTER — Ambulatory Visit: Payer: Medicaid Other

## 2022-03-26 ENCOUNTER — Ambulatory Visit (HOSPITAL_BASED_OUTPATIENT_CLINIC_OR_DEPARTMENT_OTHER): Payer: Medicaid Other

## 2022-04-01 ENCOUNTER — Ambulatory Visit (INDEPENDENT_AMBULATORY_CARE_PROVIDER_SITE_OTHER): Payer: Self-pay | Admitting: General Practice

## 2022-04-01 VITALS — BP 186/94 | HR 65 | Ht 63.5 in | Wt 113.4 lb

## 2022-04-01 DIAGNOSIS — R300 Dysuria: Secondary | ICD-10-CM

## 2022-04-01 LAB — POCT URINALYSIS DIPSTICK
Glucose, UA: NEGATIVE
Ketones, UA: NEGATIVE
Leukocytes, UA: NEGATIVE
Nitrite, UA: NEGATIVE
Protein, UA: POSITIVE — AB
Spec Grav, UA: 1.02 (ref 1.010–1.025)
Urobilinogen, UA: NEGATIVE E.U./dL — AB
pH, UA: 6.5 (ref 5.0–8.0)

## 2022-04-01 MED ORDER — NITROFURANTOIN MONOHYD MACRO 100 MG PO CAPS: 100.0000 mg | ORAL_CAPSULE | Freq: Two times a day (BID) | ORAL | 0 refills | Status: DC

## 2022-04-01 NOTE — Progress Notes (Signed)
SUBJECTIVE: Susan Cruz is a 46 y.o. female who complains of urinary frequency, urgency and dysuria x 2  weeks, without flank pain, fever, chills, or abnormal vaginal discharge or bleeding.   OBJECTIVE: Appears well, in no apparent distress.  Vital signs are normal. Urine dipstick shows positive for RBC's and positive for protein.    ASSESSMENT: Dysuria  PLAN:  Macrobid sent per protocol. Further treatment to be determined once lab results are received. Call or return to clinic prn if these symptoms worsen or fail to improve as anticipated. Pt encouraged to f/u with PCP for HTN

## 2022-04-03 LAB — URINE CULTURE

## 2022-04-08 ENCOUNTER — Other Ambulatory Visit: Payer: Self-pay | Admitting: Obstetrics and Gynecology

## 2022-04-08 DIAGNOSIS — N939 Abnormal uterine and vaginal bleeding, unspecified: Secondary | ICD-10-CM

## 2022-04-09 ENCOUNTER — Institutional Professional Consult (permissible substitution): Payer: Medicaid Other | Admitting: Obstetrics and Gynecology

## 2022-04-15 ENCOUNTER — Ambulatory Visit (HOSPITAL_BASED_OUTPATIENT_CLINIC_OR_DEPARTMENT_OTHER)
Admission: RE | Admit: 2022-04-15 | Discharge: 2022-04-15 | Disposition: A | Discharge status: Home / Self Care | Payer: Medicaid Other | Source: Ambulatory Visit | Attending: Obstetrics and Gynecology | Admitting: Obstetrics and Gynecology

## 2022-04-15 DIAGNOSIS — N939 Abnormal uterine and vaginal bleeding, unspecified: Secondary | ICD-10-CM | POA: Diagnosis present

## 2022-05-07 ENCOUNTER — Ambulatory Visit: Payer: Commercial Managed Care - HMO | Admitting: Obstetrics and Gynecology

## 2022-06-10 ENCOUNTER — Ambulatory Visit: Payer: Commercial Managed Care - HMO | Admitting: Family Medicine

## 2022-06-20 ENCOUNTER — Encounter (HOSPITAL_BASED_OUTPATIENT_CLINIC_OR_DEPARTMENT_OTHER): Payer: Self-pay

## 2022-06-20 ENCOUNTER — Emergency Department (HOSPITAL_BASED_OUTPATIENT_CLINIC_OR_DEPARTMENT_OTHER): Payer: Self-pay | Admitting: Radiology

## 2022-06-20 ENCOUNTER — Other Ambulatory Visit: Payer: Self-pay

## 2022-06-20 ENCOUNTER — Emergency Department (HOSPITAL_BASED_OUTPATIENT_CLINIC_OR_DEPARTMENT_OTHER)
Admission: EM | Admit: 2022-06-20 | Discharge: 2022-06-20 | Discharge status: Against Medical Advice | Payer: Self-pay | Attending: Emergency Medicine | Admitting: Emergency Medicine

## 2022-06-20 DIAGNOSIS — Z5321 Procedure and treatment not carried out due to patient leaving prior to being seen by health care provider: Secondary | ICD-10-CM | POA: Insufficient documentation

## 2022-06-20 DIAGNOSIS — R0789 Other chest pain: Secondary | ICD-10-CM | POA: Insufficient documentation

## 2022-06-20 DIAGNOSIS — I10 Essential (primary) hypertension: Secondary | ICD-10-CM | POA: Insufficient documentation

## 2022-06-20 DIAGNOSIS — Z79899 Other long term (current) drug therapy: Secondary | ICD-10-CM | POA: Insufficient documentation

## 2022-06-20 NOTE — ED Triage Notes (Addendum)
Patient BIB [facility] from Work.  Endorses taking an iron Pill today that was not painful but did feel lodged into throat/chest. Then at approximately 1200 today the Patient ate a Funyun and a Piece of [employer] that caused a great deal of discomfort in throat and chest.   Still painful to swallow and drink PO Fluids. VSS with EMS but Hypertensive at 332 Systolic. No EMS Interventions otherwise.   NAD Noted during Triage. A&Ox4. GCS 15. BIB Stretcher/Wheelchair.

## 2022-06-24 ENCOUNTER — Ambulatory Visit: Payer: Medicaid Other | Admitting: Obstetrics & Gynecology

## 2022-07-01 ENCOUNTER — Encounter: Payer: Self-pay | Admitting: Obstetrics and Gynecology

## 2022-07-01 ENCOUNTER — Ambulatory Visit (INDEPENDENT_AMBULATORY_CARE_PROVIDER_SITE_OTHER): Payer: Self-pay | Admitting: Obstetrics and Gynecology

## 2022-07-01 VITALS — BP 116/78 | HR 70 | Ht 63.0 in | Wt 110.0 lb

## 2022-07-01 DIAGNOSIS — N926 Irregular menstruation, unspecified: Secondary | ICD-10-CM | POA: Insufficient documentation

## 2022-07-01 DIAGNOSIS — D252 Subserosal leiomyoma of uterus: Secondary | ICD-10-CM

## 2022-07-01 DIAGNOSIS — D649 Anemia, unspecified: Secondary | ICD-10-CM

## 2022-07-01 DIAGNOSIS — D251 Intramural leiomyoma of uterus: Secondary | ICD-10-CM

## 2022-07-01 DIAGNOSIS — D259 Leiomyoma of uterus, unspecified: Secondary | ICD-10-CM | POA: Insufficient documentation

## 2022-07-01 DIAGNOSIS — D250 Submucous leiomyoma of uterus: Secondary | ICD-10-CM

## 2022-07-01 MED ORDER — PNV-DHA+DOCUSATE 27-1.25-300 MG PO CAPS: 1.0000 | ORAL_CAPSULE | Freq: Every day | ORAL | 4 refills | Status: DC

## 2022-07-01 MED ORDER — NORETHIN ACE-ETH ESTRAD-FE 1-20 MG-MCG(24) PO TABS: 1.0000 | ORAL_TABLET | Freq: Every day | ORAL | 3 refills | Status: DC

## 2022-07-01 NOTE — Progress Notes (Signed)
47 y.o GYN presents for [place] results follow up Last PAP 10/01/2021. C/o spotting and cramping between periods. Pt requested that her Rx be sent to [facility] on [address].

## 2022-07-01 NOTE — Progress Notes (Signed)
  CC: irregular bleeding, ultrasound follow up Subjective:    Patient ID: Susan Cruz, female    DOB: 1975/07/31, 47 y.o.   MRN: [id number]  HPI 47 yo G3P3 seen for ultrasound follow up and discussion of irregular bleeding.  Pt initially started with depo provera, but this caused irregular bleeding.  She transitioned to [facility] and an ultrasound had been previously ordered.  Bleeding improved, but she still has some breakthrough bleeding mid-pack.  Ultrasound did show three fibroids.  Pap smear is current.   Review of Systems     Objective:   Physical Exam Vitals:   07/01/22 0912  BP: 116/78  Pulse: 70   CLINICAL DATA:  Abnormal uterine bleeding since 04/03/2022   EXAM: TRANSABDOMINAL AND TRANSVAGINAL ULTRASOUND OF PELVIS   TECHNIQUE: Both transabdominal and transvaginal ultrasound examinations of the pelvis were performed. Transabdominal technique was performed for global imaging of the pelvis including uterus, ovaries, adnexal regions, and pelvic cul-de-sac. It was necessary to proceed with endovaginal exam following the transabdominal exam to visualize the uterus, endometrium, and ovaries.   COMPARISON:  None Available.   FINDINGS: Uterus   Measurements: 5.0 x 2.7 x 5.4 cm = volume: 37 mL. Retroverted, to RIGHT. Several nodular areas are seen within the myometrium consistent with leiomyomata, including 3.9 cm probable subserosal leiomyoma mid uterus, 2.5 cm subserosal leiomyoma posterior LEFT uterus, and 4.2 cm posterior RIGHT fundal leiomyoma.   Endometrium   Predominantly obscured by leiomyomata, with a short segment 2 mm thick seen adjacent to a small amount of mid uterine endometrial fluid.   Right ovary   Not visualized, likely obscured by bowel   Left ovary   Not visualized, likely obscured by bowel   Other findings   No free pelvic fluid or adnexal masses.   IMPRESSION: 3 uterine leiomyomata, 1 of which extends submucosal.   Nonvisualization  of ovaries and suboptimal visualization of endometrial complex as above.          Assessment & Plan:   1. Severe anemia  - Prenat-FeFum-DSS-FA-DHA w/o A (PNV-DHA+DOCUSATE) 27-1.25-300 MG CAPS; Take 1 capsule by mouth daily before breakfast.  Dispense: 90 capsule; Refill: 4  2. Intramural, submucous, and subserous leiomyoma of uterus Discussed treatment option for fibroids which may be contributing to irregular bleeding.   Myfembree UFE Sonata treatment Hysterectomy Risks and benefits given for all.  Pt is considering Sonata.  Website information given.  She is aware she will need to fill out separate insurance consent before proceeding.  3. Irregular menses See above.  If UFE or Sonata procedure fails, would proceed with hysterectomy.  Will schedule once decision is made.    Griffin Basil, MD Faculty Attending, Center for [facility]

## 2022-07-24 ENCOUNTER — Other Ambulatory Visit: Payer: Self-pay

## 2022-07-24 ENCOUNTER — Encounter (HOSPITAL_COMMUNITY): Payer: Self-pay | Admitting: *Deleted

## 2022-07-24 ENCOUNTER — Emergency Department (HOSPITAL_COMMUNITY)
Admission: EM | Admit: 2022-07-24 | Discharge: 2022-07-24 | Disposition: A | Discharge status: Home / Self Care | Payer: Commercial Managed Care - HMO | Attending: Student | Admitting: Student

## 2022-07-24 DIAGNOSIS — S39012A Strain of muscle, fascia and tendon of lower back, initial encounter: Secondary | ICD-10-CM | POA: Diagnosis not present

## 2022-07-24 DIAGNOSIS — R109 Unspecified abdominal pain: Secondary | ICD-10-CM

## 2022-07-24 DIAGNOSIS — Y92410 Unspecified street and highway as the place of occurrence of the external cause: Secondary | ICD-10-CM | POA: Insufficient documentation

## 2022-07-24 DIAGNOSIS — S3992XA Unspecified injury of lower back, initial encounter: Secondary | ICD-10-CM | POA: Diagnosis present

## 2022-07-24 DIAGNOSIS — R1030 Lower abdominal pain, unspecified: Secondary | ICD-10-CM | POA: Diagnosis not present

## 2022-07-24 HISTORY — DX: Leiomyoma of uterus, unspecified: D25.9

## 2022-07-24 MED ORDER — ONDANSETRON 4 MG PO TBDP
8.0000 mg | ORAL_TABLET | Freq: Once | ORAL | Status: AC
  Administered 2022-07-24: 8 mg via ORAL
  Filled 2022-07-24: qty 2

## 2022-07-24 MED ORDER — HYDROCODONE-ACETAMINOPHEN 5-325 MG PO TABS
1.0000 | ORAL_TABLET | Freq: Once | ORAL | Status: AC
  Administered 2022-07-24: 1 via ORAL
  Filled 2022-07-24: qty 1

## 2022-07-24 MED ORDER — KETOROLAC TROMETHAMINE 15 MG/ML IJ SOLN
15.0000 mg | Freq: Once | INTRAMUSCULAR | Status: AC
  Administered 2022-07-24: 15 mg via INTRAMUSCULAR
  Filled 2022-07-24: qty 1

## 2022-07-24 MED ORDER — NAPROXEN 375 MG PO TABS: 375.0000 mg | ORAL_TABLET | Freq: Two times a day (BID) | ORAL | 0 refills | Status: DC

## 2022-07-24 MED ORDER — LIDOCAINE 5 % EX PTCH: 1.0000 | MEDICATED_PATCH | CUTANEOUS | 0 refills | Status: AC

## 2022-07-24 MED ORDER — METHOCARBAMOL 500 MG PO TABS: 500.0000 mg | ORAL_TABLET | Freq: Two times a day (BID) | ORAL | 0 refills | Status: AC
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
## 2022-07-27 ENCOUNTER — Encounter (HOSPITAL_COMMUNITY): Payer: Self-pay

## 2022-07-27 ENCOUNTER — Ambulatory Visit (HOSPITAL_COMMUNITY)
Admission: EM | Admit: 2022-07-27 | Discharge: 2022-07-27 | Disposition: A | Discharge status: Home / Self Care | Payer: Commercial Managed Care - HMO | Attending: Emergency Medicine | Admitting: Emergency Medicine

## 2022-07-27 DIAGNOSIS — M545 Low back pain, unspecified: Secondary | ICD-10-CM

## 2022-07-27 MED ORDER — KETOROLAC TROMETHAMINE 30 MG/ML IJ SOLN
INTRAMUSCULAR | Status: AC
  Filled 2022-07-27: qty 1

## 2022-07-27 MED ORDER — IBUPROFEN 800 MG PO TABS: 800.0000 mg | ORAL_TABLET | Freq: Four times a day (QID) | ORAL | 0 refills | Status: DC | PRN

## 2022-07-27 MED ORDER — KETOROLAC TROMETHAMINE 30 MG/ML IJ SOLN
30.0000 mg | Freq: Once | INTRAMUSCULAR | Status: AC
  Administered 2022-07-27: 30 mg via INTRAMUSCULAR
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
## 2022-07-27 NOTE — Discharge Instructions (Addendum)
Tomorrow you can start ibuprofen 3x daily Continue muscle relaxer at night Lidocaine patch Gentle stretching Hot pad  Please see orthopedics if still having pain  QR code on the last page can be scanned to set up with primary care!

## 2022-08-18 ENCOUNTER — Telehealth: Payer: Self-pay

## 2022-08-18 NOTE — Telephone Encounter (Signed)
[facility] and FMLA

## 2022-09-08 ENCOUNTER — Telehealth: Payer: Self-pay

## 2022-09-08 NOTE — Telephone Encounter (Signed)
Called patient to discuss Accommodations paperwork. Patient is requesting to take a bathroom break every 30 minutes to allow time to change pads. States that she soaks through an ultra tampon and and pad every about 30 minutes during her cycle. Cycle is due starting around April 28. Please advise.  Patient last saw Dr. Donavan Foil on 1/30.  Sonata procedure coverage determination is pending.

## 2022-09-10 ENCOUNTER — Other Ambulatory Visit: Payer: Self-pay

## 2022-09-10 NOTE — Telephone Encounter (Signed)
Left message on vm to discuss options. No answer. Left vm for patient to return call

## 2022-09-11 ENCOUNTER — Other Ambulatory Visit: Payer: Self-pay

## 2022-09-11 ENCOUNTER — Ambulatory Visit: Payer: Commercial Managed Care - HMO | Admitting: Family Medicine

## 2022-09-11 DIAGNOSIS — N939 Abnormal uterine and vaginal bleeding, unspecified: Secondary | ICD-10-CM

## 2022-09-11 MED ORDER — TRANEXAMIC ACID 650 MG PO TABS: 1300.0000 mg | ORAL_TABLET | Freq: Three times a day (TID) | ORAL | 2 refills | Status: DC

## 2022-09-22 ENCOUNTER — Telehealth: Payer: Self-pay

## 2022-09-22 NOTE — Telephone Encounter (Signed)
Called patient to discuss potential surgery dates, no answer, voicemail greeting does not confirm patient identity.Barrister's clerk greeting)

## 2022-10-02 ENCOUNTER — Encounter: Payer: Self-pay | Admitting: Obstetrics and Gynecology

## 2022-10-02 ENCOUNTER — Telehealth: Payer: Self-pay

## 2022-10-02 ENCOUNTER — Ambulatory Visit (INDEPENDENT_AMBULATORY_CARE_PROVIDER_SITE_OTHER): Payer: Commercial Managed Care - HMO | Admitting: Obstetrics and Gynecology

## 2022-10-02 VITALS — BP 154/82 | HR 56 | Ht 63.5 in | Wt 106.1 lb

## 2022-10-02 DIAGNOSIS — D252 Subserosal leiomyoma of uterus: Secondary | ICD-10-CM | POA: Diagnosis not present

## 2022-10-02 DIAGNOSIS — D251 Intramural leiomyoma of uterus: Secondary | ICD-10-CM

## 2022-10-02 DIAGNOSIS — D250 Submucous leiomyoma of uterus: Secondary | ICD-10-CM | POA: Diagnosis not present

## 2022-10-02 DIAGNOSIS — Z01818 Encounter for other preprocedural examination: Secondary | ICD-10-CM

## 2022-10-02 MED ORDER — MISOPROSTOL 100 MCG PO TABS: ORAL_TABLET | ORAL | 0 refills | Status: DC

## 2022-10-02 NOTE — Progress Notes (Signed)
OB/GYN Pre-Op History and Physical  Susan Cruz is a 47 y.o. [phone] presenting for preoperative visit. Discussed the Sonata procedure in detail.  Risks and benefits of the procedure given including bleeding, infection, involvement of other organs as well as uterine perforation.       Past Medical History:  Diagnosis Date   Panic attacks    Uterine fibroid     Past Surgical History:  Procedure Laterality Date   NO PAST SURGERIES      OB History  Gravida Para Term Preterm AB Living  3 3 2 1   3   SAB IAB Ectopic Multiple Live Births               # Outcome Date GA Lbr Len/2nd Weight Sex Delivery Anes PTL Lv  3 Term           2 Term           1 Preterm             Social History   Socioeconomic History   Marital status: Divorced    Spouse name: Not on file   Number of children: Not on file   Years of education: Not on file   Highest education level: Not on file  Occupational History   Occupation: Scientist, product/process development support  Tobacco Use   Smoking status: Never   Smokeless tobacco: Never  Vaping Use   Vaping Use: Never used  Substance and Sexual Activity   Alcohol use: Yes    Comment: social   Drug use: No   Sexual activity: Yes    Birth control/protection: None, OCP  Other Topics Concern   Not on file  Social History Narrative   Work for [employer], Clinical biochemist. Lives with 67 year older daughter and 38 years old son. Has 3 children the last is currently in college.    Social Determinants of Health   Financial Resource Strain: Not on file  Food Insecurity: Not on file  Transportation Needs: Not on file  Physical Activity: Not on file  Stress: Not on file  Social Connections: Not on file    Family History  Problem Relation Age of Onset   Alzheimer's disease Maternal Grandmother    Stroke Father    Anuerysm Mother     (Not in a hospital admission)   Allergies  Allergen Reactions   Stadol [Butorphanol] Itching    Review of Systems: Negative  except for what is mentioned in HPI.     Physical Exam: BP (!) 154/82   Pulse (!) 56   Ht 5' 3.5" (1.613 m)   Wt 106 lb 1.6 oz (48.1 kg)   LMP 09/23/2022   BMI 18.50 kg/m  CONSTITUTIONAL: Well-developed, well-nourished female in no acute distress.  HENT:  Normocephalic, atraumatic, External right and left ear normal. Oropharynx is clear and moist EYES: Conjunctivae and EOM are normal.   NECK: Normal range of motion, supple, no masses SKIN: Skin is warm and dry. No rash noted. Not diaphoretic. No erythema. No pallor. NEUROLGIC: Alert and oriented to person, place, and time. Normal reflexes, muscle tone coordination. No cranial nerve deficit noted. PSYCHIATRIC: Normal mood and affect. Normal behavior. Normal judgment and thought content. CARDIOVASCULAR: Normal heart rate noted, regular rhythm RESPIRATORY: Effort and breath sounds normal, no problems with respiration noted ABDOMEN: Soft, nontender, nondistended PELVIC:normal vagina, normal easy to access cervix MUSCULOSKELETAL: Normal range of motion. No edema and no tenderness. 2+ distal pulses.   Pertinent Labs/Studies:  CLINICAL DATA:  Abnormal uterine bleeding since 04/03/2022   EXAM: TRANSABDOMINAL AND TRANSVAGINAL ULTRASOUND OF PELVIS   TECHNIQUE: Both transabdominal and transvaginal ultrasound examinations of the pelvis were performed. Transabdominal technique was performed for global imaging of the pelvis including uterus, ovaries, adnexal regions, and pelvic cul-de-sac. It was necessary to proceed with endovaginal exam following the transabdominal exam to visualize the uterus, endometrium, and ovaries.   COMPARISON:  None Available.   FINDINGS: Uterus   Measurements: 5.0 x 2.7 x 5.4 cm = volume: 37 mL. Retroverted, to RIGHT. Several nodular areas are seen within the myometrium consistent with leiomyomata, including 3.9 cm probable subserosal leiomyoma mid uterus, 2.5 cm subserosal leiomyoma posterior  LEFT uterus, and 4.2 cm posterior RIGHT fundal leiomyoma.   Endometrium   Predominantly obscured by leiomyomata, with a short segment 2 mm thick seen adjacent to a small amount of mid uterine endometrial fluid.   Right ovary   Not visualized, likely obscured by bowel   Left ovary   Not visualized, likely obscured by bowel   Other findings   No free pelvic fluid or adnexal masses.   IMPRESSION: 3 uterine leiomyomata, 1 of which extends submucosal.   Nonvisualization of ovaries and suboptimal visualization of endometrial complex as above.      Assessment and Plan :Susan Cruz is a 47 y.o. [phone] here for preoperative visit.   Plan for Sonata fibroid ablation NPO Admission labs ordered VS Q4 Pre-medicate with cytotec for cervical ripening before the procedure.  Elevated BP noted, pt is trying to establish care with a PCP for future management.   Mariel Aloe, M.D. Attending Obstetrician & Gynecologist, [facility] for [employer], [facility]

## 2022-10-02 NOTE — Telephone Encounter (Signed)
Called patient, no answer, left voicemail with new surgery location and time.

## 2022-10-02 NOTE — Progress Notes (Signed)
Pt is in the office for [facility] consult LMP 09/23/22 Pt had elevated BPs today, reports occasional swelling in feet and blurry vision.

## 2022-10-03 ENCOUNTER — Encounter (HOSPITAL_BASED_OUTPATIENT_CLINIC_OR_DEPARTMENT_OTHER): Payer: Self-pay | Admitting: Obstetrics and Gynecology

## 2022-10-06 ENCOUNTER — Encounter (HOSPITAL_BASED_OUTPATIENT_CLINIC_OR_DEPARTMENT_OTHER): Payer: Self-pay | Admitting: Obstetrics and Gynecology

## 2022-10-06 NOTE — Progress Notes (Signed)
Spoke w/ via phone for pre-op interview--- pt Lab needs dos----    cbc, t&s, urine preg            Lab results------ no COVID test -----patient states asymptomatic no test needed Arrive at ------- 1100 on 10-14-2022 NPO after MN NO Solid Food.  Clear liquids from MN until--- 1000 Med rec completed Medications to take morning of surgery ----- loestrin Diabetic medication ----- n/a Patient instructed no nail polish to be worn day of surgery Patient instructed to bring photo id and insurance card day of surgery Patient aware to have Driver (ride ) / caregiver    for 24 hours after surgery -- friend, ciera Patient Special Instructions ----- n/a  Pre-Op special Instructions ----- n/a Patient verbalized understanding of instructions that were given at this phone interview. Patient denies shortness of breath, chest pain, fever, cough at this phone interview.

## 2022-10-13 ENCOUNTER — Ambulatory Visit (HOSPITAL_BASED_OUTPATIENT_CLINIC_OR_DEPARTMENT_OTHER): Payer: Commercial Managed Care - HMO | Admitting: Anesthesiology

## 2022-10-14 ENCOUNTER — Other Ambulatory Visit: Payer: Self-pay | Admitting: Obstetrics and Gynecology

## 2022-10-14 ENCOUNTER — Ambulatory Visit (HOSPITAL_COMMUNITY)
Admission: RE | Admit: 2022-10-14 | Discharge: 2022-10-14 | Disposition: A | Discharge status: Home / Self Care | Payer: Commercial Managed Care - HMO | Attending: Obstetrics and Gynecology | Admitting: Obstetrics and Gynecology

## 2022-10-14 ENCOUNTER — Encounter (HOSPITAL_BASED_OUTPATIENT_CLINIC_OR_DEPARTMENT_OTHER)
Admission: RE | Disposition: A | Discharge status: Home / Self Care | Payer: Self-pay | Source: Home / Self Care | Attending: Obstetrics and Gynecology

## 2022-10-14 ENCOUNTER — Other Ambulatory Visit: Payer: Self-pay

## 2022-10-14 ENCOUNTER — Encounter (HOSPITAL_BASED_OUTPATIENT_CLINIC_OR_DEPARTMENT_OTHER): Payer: Self-pay | Admitting: Obstetrics and Gynecology

## 2022-10-14 DIAGNOSIS — D251 Intramural leiomyoma of uterus: Secondary | ICD-10-CM | POA: Diagnosis not present

## 2022-10-14 DIAGNOSIS — Z01818 Encounter for other preprocedural examination: Secondary | ICD-10-CM

## 2022-10-14 DIAGNOSIS — D500 Iron deficiency anemia secondary to blood loss (chronic): Secondary | ICD-10-CM

## 2022-10-14 DIAGNOSIS — Z539 Procedure and treatment not carried out, unspecified reason: Secondary | ICD-10-CM | POA: Insufficient documentation

## 2022-10-14 HISTORY — DX: Personal history of other mental and behavioral disorders: Z86.59

## 2022-10-14 HISTORY — DX: Iron deficiency anemia, unspecified: D50.9

## 2022-10-14 HISTORY — DX: Localized edema: R60.0

## 2022-10-14 LAB — POCT I-STAT, CHEM 8
BUN: 14 mg/dL (ref 6–20)
Calcium, Ion: 1.26 mmol/L (ref 1.15–1.40)
Chloride: 103 mmol/L (ref 98–111)
Creatinine, Ser: 0.8 mg/dL (ref 0.44–1.00)
Glucose, Bld: 83 mg/dL (ref 70–99)
HCT: 27 % — ABNORMAL LOW (ref 36.0–46.0)
Hemoglobin: 9.2 g/dL — ABNORMAL LOW (ref 12.0–15.0)
Potassium: 4.7 mmol/L (ref 3.5–5.1)
Sodium: 139 mmol/L (ref 135–145)
TCO2: 26 mmol/L (ref 22–32)

## 2022-10-14 LAB — CBC
HCT: 25.7 % — ABNORMAL LOW (ref 36.0–46.0)
HCT: 24.3 % — ABNORMAL LOW (ref 36.0–46.0)
Hemoglobin: 6.8 g/dL — CL (ref 12.0–15.0)
Hemoglobin: 6.5 g/dL — CL (ref 12.0–15.0)
MCH: 17 pg — ABNORMAL LOW (ref 26.0–34.0)
MCH: 17.1 pg — ABNORMAL LOW (ref 26.0–34.0)
MCHC: 26.5 g/dL — ABNORMAL LOW (ref 30.0–36.0)
MCHC: 26.7 g/dL — ABNORMAL LOW (ref 30.0–36.0)
MCV: 64.1 fL — ABNORMAL LOW (ref 80.0–100.0)
MCV: 63.8 fL — ABNORMAL LOW (ref 80.0–100.0)
Platelets: 313 10*3/uL (ref 150–400)
Platelets: 325 10*3/uL (ref 150–400)
RBC: 4.01 MIL/uL (ref 3.87–5.11)
RBC: 3.81 MIL/uL — ABNORMAL LOW (ref 3.87–5.11)
RDW: 20.7 % — ABNORMAL HIGH (ref 11.5–15.5)
RDW: 20.3 % — ABNORMAL HIGH (ref 11.5–15.5)
WBC: 5.4 10*3/uL (ref 4.0–10.5)
WBC: 5.6 10*3/uL (ref 4.0–10.5)
nRBC: 0 % (ref 0.0–0.2)
nRBC: 0 % (ref 0.0–0.2)

## 2022-10-14 LAB — TYPE AND SCREEN
ABO/RH(D): O POS
Antibody Screen: NEGATIVE

## 2022-10-14 LAB — POCT PREGNANCY, URINE: Preg Test, Ur: NEGATIVE

## 2022-10-14 LAB — ABO/RH: ABO/RH(D): O POS

## 2022-10-14 SURGERY — RADIOFREQUENCY ABLATION, LEIOMYOMA, UTERUS, TRANSCERVICAL APPROACH, WITH US GUIDANCE
Anesthesia: General

## 2022-10-14 MED ORDER — LACTATED RINGERS IV SOLN: INTRAVENOUS | Status: DC

## 2022-10-14 MED ORDER — CELECOXIB 200 MG PO CAPS
200.0000 mg | ORAL_CAPSULE | Freq: Once | ORAL | Status: AC
  Administered 2022-10-14: 200 mg via ORAL

## 2022-10-14 MED ORDER — ACETAMINOPHEN 500 MG PO TABS
1000.0000 mg | ORAL_TABLET | ORAL | Status: AC
  Administered 2022-10-14: 1000 mg via ORAL

## 2022-10-14 MED ORDER — SOD CITRATE-CITRIC ACID 500-334 MG/5ML PO SOLN: 30.0000 mL | ORAL | Status: DC

## 2022-10-14 MED ORDER — ACETAMINOPHEN 500 MG PO TABS
ORAL_TABLET | ORAL | Status: AC
  Filled 2022-10-14: qty 2

## 2022-10-14 MED ORDER — ACETAMINOPHEN 500 MG PO TABS: 1000.0000 mg | ORAL_TABLET | Freq: Once | ORAL | Status: DC

## 2022-10-14 MED ORDER — CELECOXIB 200 MG PO CAPS
ORAL_CAPSULE | ORAL | Status: AC
  Filled 2022-10-14: qty 1

## 2022-10-14 MED ORDER — CEFAZOLIN SODIUM-DEXTROSE 2-4 GM/100ML-% IV SOLN: 2.0000 g | INTRAVENOUS | Status: DC

## 2022-10-14 MED ORDER — POVIDONE-IODINE 10 % EX SWAB: 2.0000 | Freq: Once | CUTANEOUS | Status: DC

## 2022-10-14 MED ORDER — CEFAZOLIN SODIUM-DEXTROSE 2-4 GM/100ML-% IV SOLN
INTRAVENOUS | Status: AC
  Filled 2022-10-14: qty 100

## 2022-10-14 SURGICAL SUPPLY — 20 items
CATH ROBINSON RED A/P 16FR (CATHETERS) ×1 IMPLANT
CATH SILICONE 16FRX5CC (CATHETERS) IMPLANT
DEVICE MYOSURE LITE (MISCELLANEOUS) IMPLANT
DEVICE MYOSURE REACH (MISCELLANEOUS) IMPLANT
ELECT DISPERSIVE SONATA (ELECTRODE) ×2 IMPLANT
GAUZE 4X4 16PLY ~~LOC~~+RFID DBL (SPONGE) IMPLANT
GLOVE BIO SURGEON STRL SZ8 (GLOVE) ×1 IMPLANT
GOWN STRL REUS W/TWL LRG LVL3 (GOWN DISPOSABLE) ×1 IMPLANT
HANDPIECE RFA SONATA (MISCELLANEOUS) ×1 IMPLANT
KIT PROCEDURE FLUENT (KITS) IMPLANT
KIT TURNOVER CYSTO (KITS) ×2 IMPLANT
MYOSURE XL FIBROID (MISCELLANEOUS)
PACK VAGINAL MINOR WOMEN LF (CUSTOM PROCEDURE TRAY) ×1 IMPLANT
PAD OB MATERNITY 4.3X12.25 (PERSONAL CARE ITEMS) ×1 IMPLANT
PAD PREP 24X48 CUFFED NSTRL (MISCELLANEOUS) ×1 IMPLANT
SEAL ROD LENS SCOPE MYOSURE (ABLATOR) IMPLANT
SLEEVE SCD COMPRESS KNEE MED (STOCKING) ×2 IMPLANT
SYR 50ML LL SCALE MARK (SYRINGE) IMPLANT
SYSTEM TISS REMOVAL MYOSURE XL (MISCELLANEOUS) IMPLANT
TOWEL OR 17X24 6PK STRL BLUE (TOWEL DISPOSABLE) ×1 IMPLANT

## 2022-10-14 NOTE — Progress Notes (Signed)
Lab called with critical lab, hemoglobin 6.8.  Result reported to Charlaine Dalton RN, pre-op nurse caring for patient.

## 2022-10-14 NOTE — Progress Notes (Addendum)
Patients surgery canceled d/t Hgb 6.5. Pt aware. MD at bedside.

## 2022-10-14 NOTE — Anesthesia Preprocedure Evaluation (Deleted)
Anesthesia Evaluation  Patient identified by MRN, date of birth, ID band Patient awake    Reviewed: Allergy & Precautions, H&P , NPO status , Patient's Chart, lab work & pertinent test results  Airway Mallampati: I  TM Distance: >3 FB Neck ROM: Full    Dental no notable dental hx. (+) Dental Advisory Given, Teeth Intact   Pulmonary neg pulmonary ROS   Pulmonary exam normal breath sounds clear to auscultation       Cardiovascular Exercise Tolerance: Good negative cardio ROS Normal cardiovascular exam Rhythm:Regular Rate:Normal     Neuro/Psych   Anxiety     negative neurological ROS  negative psych ROS   GI/Hepatic negative GI ROS, Neg liver ROS,,,  Endo/Other  negative endocrine ROS    Renal/GU negative Renal ROS  negative genitourinary   Musculoskeletal negative musculoskeletal ROS (+)    Abdominal   Peds negative pediatric ROS (+)  Hematology negative hematology ROS (+) Blood dyscrasia, anemia   Anesthesia Other Findings   Reproductive/Obstetrics negative OB ROS                             Anesthesia Physical Anesthesia Plan  ASA: 3  Anesthesia Plan: General   Post-op Pain Management: Minimal or no pain anticipated, Tylenol PO (pre-op)* and Celebrex PO (pre-op)*   Induction:   PONV Risk Score and Plan: 3 and Ondansetron, Dexamethasone and Treatment may vary due to age or medical condition  Airway Management Planned: LMA and Oral ETT  Additional Equipment: None  Intra-op Plan:   Post-operative Plan:   Informed Consent: I have reviewed the patients History and Physical, chart, labs and discussed the procedure including the risks, benefits and alternatives for the proposed anesthesia with the patient or authorized representative who has indicated his/her understanding and acceptance.       Plan Discussed with: Anesthesiologist and CRNA  Anesthesia Plan Comments:          Anesthesia Quick Evaluation

## 2022-10-15 ENCOUNTER — Observation Stay (HOSPITAL_COMMUNITY)
Admission: AD | Admit: 2022-10-15 | Discharge: 2022-10-15 | Discharge status: Against Medical Advice | Payer: Commercial Managed Care - HMO | Attending: Obstetrics and Gynecology | Admitting: Obstetrics and Gynecology

## 2022-10-15 DIAGNOSIS — D649 Anemia, unspecified: Principal | ICD-10-CM | POA: Diagnosis present

## 2022-10-15 DIAGNOSIS — D500 Iron deficiency anemia secondary to blood loss (chronic): Secondary | ICD-10-CM

## 2022-10-15 DIAGNOSIS — N920 Excessive and frequent menstruation with regular cycle: Secondary | ICD-10-CM | POA: Diagnosis present

## 2022-10-15 DIAGNOSIS — D259 Leiomyoma of uterus, unspecified: Secondary | ICD-10-CM | POA: Diagnosis not present

## 2022-10-15 DIAGNOSIS — Z79899 Other long term (current) drug therapy: Secondary | ICD-10-CM | POA: Insufficient documentation

## 2022-10-15 DIAGNOSIS — Z91040 Latex allergy status: Secondary | ICD-10-CM | POA: Diagnosis not present

## 2022-10-15 LAB — BPAM RBC: ISSUE DATE / TIME: 202405151445

## 2022-10-15 LAB — TYPE AND SCREEN
ABO/RH(D): O POS
Antibody Screen: NEGATIVE

## 2022-10-15 LAB — HEMOGLOBIN AND HEMATOCRIT, BLOOD
HCT: 36.9 % (ref 36.0–46.0)
Hemoglobin: 11.3 g/dL — ABNORMAL LOW (ref 12.0–15.0)

## 2022-10-15 LAB — PREPARE RBC (CROSSMATCH)

## 2022-10-15 MED ORDER — SODIUM CHLORIDE 0.9 % IV SOLN: 250.0000 mL | INTRAVENOUS | Status: DC | PRN

## 2022-10-15 MED ORDER — FUROSEMIDE 10 MG/ML IJ SOLN
20.0000 mg | Freq: Once | INTRAMUSCULAR | Status: AC
  Administered 2022-10-15: 20 mg via INTRAVENOUS
  Filled 2022-10-15: qty 2

## 2022-10-15 MED ORDER — SODIUM CHLORIDE 0.9% FLUSH
3.0000 mL | Freq: Two times a day (BID) | INTRAVENOUS | Status: DC
  Administered 2022-10-15: 3 mL via INTRAVENOUS

## 2022-10-15 MED ORDER — DIPHENHYDRAMINE HCL 25 MG PO CAPS
25.0000 mg | ORAL_CAPSULE | Freq: Once | ORAL | Status: AC
  Administered 2022-10-15: 25 mg via ORAL
  Filled 2022-10-15: qty 1

## 2022-10-15 MED ORDER — SODIUM CHLORIDE 0.9% FLUSH: 3.0000 mL | INTRAVENOUS | Status: DC | PRN

## 2022-10-15 MED ORDER — ACETAMINOPHEN 325 MG PO TABS
650.0000 mg | ORAL_TABLET | Freq: Once | ORAL | Status: AC
  Administered 2022-10-15: 650 mg via ORAL
  Filled 2022-10-15: qty 2

## 2022-10-15 MED ORDER — SODIUM CHLORIDE 0.9% IV SOLUTION: Freq: Once | INTRAVENOUS | Status: AC

## 2022-10-15 NOTE — H&P (Signed)
Susan Cruz is an 47 y.o. female. [id number] Patient's last menstrual period was 09/26/2022 (exact date). History of uterine fibroids who comes for transfusion due to anemia prior to planned Sonata procedure. She has heavy menses  Pertinent Gynecological History: Menses:  heavy Blood transfusions: none  OB History: G3, P2   Menstrual History:  Patient's last menstrual period was 09/26/2022 (exact date).    Past Medical History:  Diagnosis Date   Edema of both lower extremities    per pt feet   History of panic attacks    IDA (iron deficiency anemia)    Uterine fibroid    symptomatic    Past Surgical History:  Procedure Laterality Date   TUBAL LIGATION Bilateral 08/26/2000   @[facility]  by dr Clearance Coots;   PPTL w/ epidural    Family History  Problem Relation Age of Onset   Alzheimer's disease Maternal Grandmother    Stroke Father    Anuerysm Mother     Social History:  reports that she has never smoked. She has never used smokeless tobacco. She reports current alcohol use. She reports that she does not use drugs.  Allergies:  Allergies  Allergen Reactions   Latex Hives and Swelling   Stadol [Butorphanol] Itching    Medications Prior to Admission  Medication Sig Dispense Refill Last Dose   Iron Polysacch Cmplx-B12-FA 150-0.025-1 MG CAPS Take 1 capsule by mouth every other day. (Patient taking differently: Take 1 capsule by mouth every other day.) 30 capsule 5    lidocaine (LIDODERM) 5 % Place 1 patch onto the skin daily. Remove & Discard patch within 12 hours or as directed by MD (Patient taking differently: Place 1 patch onto the skin as needed. Remove & Discard patch within 12 hours or as directed by MD) 14 patch 0    methocarbamol (ROBAXIN) 500 MG tablet Take 1 tablet (500 mg total) by mouth 2 (two) times daily. (Patient taking differently: Take 500 mg by mouth 2 (two) times daily as needed.) 20 tablet 0    misoprostol (CYTOTEC) 100 MCG tablet 1/2 tab per vagina the night  before and the morning of the procedure (Patient taking differently: 1/2 tab per vagina the night before and the morning of the procedure on 10-14-2022) 1 tablet 0    Norethindrone Acetate-Ethinyl Estrad-FE (LOESTRIN 24 FE) 1-20 MG-MCG(24) tablet Take 1 tablet by mouth daily. (Patient taking differently: Take 1 tablet by mouth daily.) 84 tablet 3    tranexamic acid (LYSTEDA) 650 MG TABS tablet Take 2 tablets (1,300 mg total) by mouth 3 (three) times daily. Take during menses for a maximum of five days (Patient not taking: Reported on 10/06/2022) 30 tablet 2     Review of Systems  Constitutional: Negative.   Respiratory: Negative.    Cardiovascular: Negative.   Gastrointestinal: Negative.   Genitourinary:  Positive for menstrual problem.    Last menstrual period 09/26/2022. Physical Exam Vitals and nursing note reviewed.  Constitutional:      Appearance: Normal appearance.  HENT:     Head: Normocephalic and atraumatic.  Cardiovascular:     Rate and Rhythm: Normal rate.  Pulmonary:     Effort: Pulmonary effort is normal.  Musculoskeletal:     Cervical back: Normal range of motion.  Skin:    Coloration: Skin is not pale.  Neurological:     Mental Status: She is alert.  Psychiatric:        Mood and Affect: Mood normal.  Behavior: Behavior normal.     Results for orders placed or performed during the hospital encounter of 10/14/22 (from the past 24 hour(s))  Pregnancy, urine POC     Status: None   Collection Time: 10/14/22 11:04 AM  Result Value Ref Range   Preg Test, Ur NEGATIVE NEGATIVE  CBC     Status: Abnormal   Collection Time: 10/14/22 11:27 AM  Result Value Ref Range   WBC 5.4 4.0 - 10.5 K/uL   RBC 4.01 3.87 - 5.11 MIL/uL   Hemoglobin 6.8 (LL) 12.0 - 15.0 g/dL   HCT 04.5 (L) 40.9 - 81.1 %   MCV 64.1 (L) 80.0 - 100.0 fL   MCH 17.0 (L) 26.0 - 34.0 pg   MCHC 26.5 (L) 30.0 - 36.0 g/dL   RDW 91.4 (H) 78.2 - 95.6 %   Platelets 313 150 - 400 K/uL   nRBC 0.0 0.0 -  0.2 %  Type and screen     Status: None   Collection Time: 10/14/22 11:27 AM  Result Value Ref Range   ABO/RH(D) O POS    Antibody Screen NEG    Sample Expiration      10/17/2022,2359 Performed at [facility], [address] [place], [place] [address]   ABO/Rh     Status: None   Collection Time: 10/14/22 12:02 PM  Result Value Ref Range   ABO/RH(D)      O POS Performed at [facility], [address], [place], [place] [address]   I-STAT, [place] [address]     Status: Abnormal   Collection Time: 10/14/22 12:31 PM  Result Value Ref Range   Sodium 139 135 - 145 mmol/L   Potassium 4.7 3.5 - 5.1 mmol/L   Chloride 103 98 - 111 mmol/L   BUN 14 6 - 20 mg/dL   Creatinine, Ser 6.96 0.44 - 1.00 mg/dL   Glucose, Bld 83 70 - 99 mg/dL   Calcium, Ion 2.95 2.84 - 1.40 mmol/L   TCO2 26 22 - 32 mmol/L   Hemoglobin 9.2 (L) 12.0 - 15.0 g/dL   HCT 13.2 (L) 44.0 - 10.2 %  CBC     Status: Abnormal   Collection Time: 10/14/22 12:33 PM  Result Value Ref Range   WBC 5.6 4.0 - 10.5 K/uL   RBC 3.81 (L) 3.87 - 5.11 MIL/uL   Hemoglobin 6.5 (LL) 12.0 - 15.0 g/dL   HCT 72.5 (L) 36.6 - 44.0 %   MCV 63.8 (L) 80.0 - 100.0 fL   MCH 17.1 (L) 26.0 - 34.0 pg   MCHC 26.7 (L) 30.0 - 36.0 g/dL   RDW 34.7 (H) 42.5 - 95.6 %   Platelets 325 150 - 400 K/uL   nRBC 0.0 0.0 - 0.2 %    No results found.  Assessment/Plan: Anemia due to menorrhagia with uterine fibroids. She will receive 3 units PRBC prior to [facility] treatment.   Scheryl Darter 10/15/2022, 10:02 AM

## 2022-10-15 NOTE — Progress Notes (Signed)
Pt received 3 units of blood, was under the impression she was to be discharged after the last units and labs were resulted. RN notified MD of labs after the completion of the blood products and MD advised pt to stay over night. Pt declined staying. RN educated pt on the importance of staying but pt still wanted to leave AMA. RN provided the appropriate forms to fill out and removed peripheral IV. MD aware of pt disposition.

## 2022-10-16 LAB — TYPE AND SCREEN
Unit division: 0
Unit division: 0
Unit division: 0

## 2022-10-16 LAB — BPAM RBC
Blood Product Expiration Date: 202406152359
Blood Product Expiration Date: 202406152359
Blood Product Expiration Date: 202406152359
ISSUE DATE / TIME: 202405151224
ISSUE DATE / TIME: 202405151657
Unit Type and Rh: 5100
Unit Type and Rh: 5100
Unit Type and Rh: 5100

## 2022-11-03 ENCOUNTER — Telehealth: Payer: Self-pay

## 2022-11-03 NOTE — Telephone Encounter (Signed)
Called patient to inform her that Dr. Donavan Foil first opening for surgery is 11/18/22 at 2:30 pm. Left my contact information on pt's voicemail and asked that she call me as soon as possible to confirm her availability and secure the opening.

## 2022-11-06 ENCOUNTER — Other Ambulatory Visit: Payer: Self-pay | Admitting: Obstetrics and Gynecology

## 2022-11-06 ENCOUNTER — Encounter (HOSPITAL_BASED_OUTPATIENT_CLINIC_OR_DEPARTMENT_OTHER): Payer: Self-pay | Admitting: Obstetrics and Gynecology

## 2022-11-06 ENCOUNTER — Encounter: Payer: Self-pay | Admitting: Obstetrics and Gynecology

## 2022-11-06 DIAGNOSIS — Z01818 Encounter for other preprocedural examination: Secondary | ICD-10-CM

## 2022-11-06 DIAGNOSIS — D250 Submucous leiomyoma of uterus: Secondary | ICD-10-CM

## 2022-11-06 MED ORDER — MISOPROSTOL 100 MCG PO TABS
ORAL_TABLET | ORAL | 0 refills | Status: DC
Start: 2022-11-06 — End: 2023-06-10

## 2022-11-06 NOTE — Progress Notes (Signed)
Cytotec reordered to prep for surgical procedure.

## 2022-11-06 NOTE — Progress Notes (Signed)
Spoke w/ via phone for pre-op interview--- pt Lab needs dos----  urine preg             Lab results------ no COVID test -----patient states asymptomatic no test needed Arrive at ------- 1230 on 11-18-2022 NPO after MN NO Solid Food.  Clear liquids from MN until--- 1130 Med rec completed Medications to take morning of surgery ----- loestrin Diabetic medication ----- n/a Patient instructed no nail polish to be worn day of surgery Patient instructed to bring photo id and insurance card day of surgery Patient aware to have Driver (ride ) / caregiver    for 24 hours after surgery -- daughter, riaihia Patient Special Instructions ----- n/a Pre-Op special Instructions ----- sent inbox message to dr bass in epic, requested orders Patient verbalized understanding of instructions that were given at this phone interview. Patient denies shortness of breath, chest pain, fever, cough at this phone interview.

## 2022-11-17 ENCOUNTER — Telehealth: Payer: Self-pay

## 2022-11-17 NOTE — Telephone Encounter (Signed)
I called the patient to notify her that I received a message indicating that her [employer] coverage wasn't active and $7,150 was needed today to proceed w/ the procedure on tomorrow. Patient advised that w/o the procedure she going to lose the remaining blood in her body that was received via the transfusion. Patient expressed that she has a new ins through her job and will call [employer] to see if an authorization os required for tomo procedure. Patient will call me back after speaking with ins.

## 2022-11-18 ENCOUNTER — Other Ambulatory Visit: Payer: Self-pay | Admitting: Obstetrics and Gynecology

## 2022-11-18 ENCOUNTER — Encounter: Payer: Self-pay | Admitting: Obstetrics and Gynecology

## 2022-11-18 ENCOUNTER — Ambulatory Visit (HOSPITAL_BASED_OUTPATIENT_CLINIC_OR_DEPARTMENT_OTHER)
Admission: RE | Admit: 2022-11-18 | Payer: BC Managed Care – PPO | Source: Home / Self Care | Admitting: Obstetrics and Gynecology

## 2022-11-18 DIAGNOSIS — D649 Anemia, unspecified: Secondary | ICD-10-CM

## 2022-11-18 DIAGNOSIS — D259 Leiomyoma of uterus, unspecified: Secondary | ICD-10-CM

## 2022-11-18 DIAGNOSIS — Z01818 Encounter for other preprocedural examination: Secondary | ICD-10-CM

## 2022-11-18 DIAGNOSIS — N926 Irregular menstruation, unspecified: Secondary | ICD-10-CM

## 2022-11-18 SURGERY — RADIOFREQUENCY ABLATION, LEIOMYOMA, UTERUS, TRANSCERVICAL APPROACH, WITH US GUIDANCE
Anesthesia: Choice

## 2022-11-18 MED ORDER — MEGESTROL ACETATE 40 MG PO TABS
40.0000 mg | ORAL_TABLET | Freq: Two times a day (BID) | ORAL | 5 refills | Status: AC
Start: 2022-11-18 — End: ?

## 2022-12-18 ENCOUNTER — Other Ambulatory Visit: Payer: Self-pay | Admitting: Obstetrics and Gynecology

## 2022-12-18 ENCOUNTER — Telehealth: Payer: Self-pay

## 2022-12-18 DIAGNOSIS — D251 Intramural leiomyoma of uterus: Secondary | ICD-10-CM

## 2022-12-18 DIAGNOSIS — D649 Anemia, unspecified: Secondary | ICD-10-CM

## 2022-12-18 DIAGNOSIS — N926 Irregular menstruation, unspecified: Secondary | ICD-10-CM

## 2022-12-18 NOTE — Telephone Encounter (Signed)
Pt called stating she is experiencing heavy bleeding and the megace she is taking is not working. Pt states she is taking the recommended dosage and has d/c birth control pills like advised.   Spoke with Dr. Donavan Foil, Dr. Donavan Foil states the only fix for this is sonata or fibroid embolization. Orders placed for fibroid embolization as that is what the patient would like to proceed with.

## 2023-01-19 ENCOUNTER — Ambulatory Visit: Payer: BC Managed Care – PPO | Admitting: Internal Medicine

## 2023-01-19 ENCOUNTER — Telehealth: Payer: Self-pay | Admitting: General Practice

## 2023-01-19 NOTE — Telephone Encounter (Signed)
Pt was a no show for a NP appt with Salvatore Decent on 01/19/23, I dismissed and did not send a letter.

## 2023-04-08 ENCOUNTER — Other Ambulatory Visit: Payer: Self-pay | Admitting: Obstetrics and Gynecology

## 2023-04-08 DIAGNOSIS — D251 Intramural leiomyoma of uterus: Secondary | ICD-10-CM

## 2023-04-08 NOTE — Progress Notes (Signed)
Referral place to reschedule Sonata.  Pt advised to recheck h/h prior to surgery incase she needs an iron or blood transfusion

## 2023-05-11 ENCOUNTER — Telehealth: Payer: Self-pay

## 2023-05-11 NOTE — Telephone Encounter (Signed)
Patient called to schedule surgery w/ Dr. Donavan Foil on 06/15/23 at [facility] @7 :30 am. Patient is aware she must arrive at 5:30 am. Pre-op instructions and surgery details were provided by phone. Surgery details will be sent to Mychart once surgery is finalized.

## 2023-06-07 ENCOUNTER — Ambulatory Visit (HOSPITAL_COMMUNITY)
Admission: EM | Admit: 2023-06-07 | Discharge: 2023-06-07 | Disposition: A | Discharge status: Home / Self Care | Payer: BC Managed Care – PPO | Attending: Physician Assistant | Admitting: Physician Assistant

## 2023-06-07 ENCOUNTER — Encounter (HOSPITAL_COMMUNITY): Payer: Self-pay | Admitting: Emergency Medicine

## 2023-06-07 DIAGNOSIS — I10 Essential (primary) hypertension: Secondary | ICD-10-CM | POA: Diagnosis not present

## 2023-06-07 HISTORY — DX: Essential (primary) hypertension: I10

## 2023-06-07 MED ORDER — AMLODIPINE BESYLATE 5 MG PO TABS: 5.0000 mg | ORAL_TABLET | Freq: Every day | ORAL | 0 refills | Status: AC

## 2023-06-07 NOTE — Discharge Instructions (Signed)
 Good to meet you today.  Please refer to the handouts provided for more information lowering your blood pressure.  Start on amlodipine  5 mg this evening.  You may take 1/2 tablet for the next few days and see how you are tolerating it.  Take this medicine around the same time each day.  Monitor your blood pressure at home, usually once a day.  Goal blood pressure is going to be less than 140/90.  Keep working on good lifestyle habits, stress management, pain management.  You may take Tylenol  or ibuprofen  as needed to help with any pain.  Hopefully, things will improve after your fibroid surgery.  You will need to follow-up with her primary care provider.  Best wishes! Happy to see you back if any concerns.

## 2023-06-07 NOTE — ED Triage Notes (Signed)
 Pt states her BP has been high since Friday. She has also had headache, dizziness and nausea that has went away.   Friday at 155/75 1/25  was 159/96 Today 159/79

## 2023-06-07 NOTE — ED Provider Notes (Signed)
 [facility]    CSN: [id number] Arrival date & time: 06/07/23  1706      History   Chief Complaint Chief Complaint  Patient presents with   Dizziness   Hypertension    HPI Susan Cruz is a 48 y.o. female Presenting today with concerns of high blood pressure for the last 3 days.  She is here with her sister today.  She reports that her blood pressure readings have been 155/75, 159/96, 159/79 at home.  She reports some headache over bilateral temples, intermittent dizzy and flushed feeling.  Only reports headache right now about 5 out of 10, equal on both sides of her head.  No chest pain or shortness of breath.  States that she has had high blood pressure readings in the past few years, but never treated for blood pressure.  States that she has also had pain from fibroids for the last few months and is having surgery next week.  She also reports that she has not been eating very well, eating mostly at restaurants lately.  She does not smoke.  Occasionally drinks. No other symptoms to report at this time.     Past Medical History:  Diagnosis Date   Edema of both lower extremities    per pt feet   History of panic attacks    IDA (iron  deficiency anemia)    Uterine fibroid    symptomatic    Patient Active Problem List   Diagnosis Date Noted   Symptomatic anemia 10/15/2022   Fibroid uterus 07/01/2022   Irregular menses 07/01/2022   Atypical chest pain 01/13/2017   Screening for viral disease 01/13/2017    Past Surgical History:  Procedure Laterality Date   TUBAL LIGATION Bilateral 08/26/2000   @[facility]  by dr rudy;   PPTL w/ epidural    OB History     Gravida  3   Para  3   Term  2   Preterm  1   AB      Living  3      SAB      IAB      Ectopic      Multiple      Live Births           Obstetric Comments  SVD x 3          Home Medications    Prior to Admission medications   Medication Sig Start Date End Date Taking?  Authorizing Provider  amLODipine  (NORVASC ) 5 MG tablet Take 1 tablet (5 mg total) by mouth daily. 06/07/23  Yes Gweneth Fredlund M, PA-C  Iron  Polysacch Cmplx-B12-FA 150-0.025-1 MG CAPS Take 1 capsule by mouth every other day. Patient taking differently: Take 1 capsule by mouth every other day. 10/02/21   Rudy Carlin LABOR, MD  lidocaine  (LIDODERM ) 5 % Place 1 patch onto the skin daily. Remove & Discard patch within 12 hours or as directed by MD Patient taking differently: Place 1 patch onto the skin as needed. Remove & Discard patch within 12 hours or as directed by MD 07/24/22   Hildegard Loge, PA-C  megestrol  (MEGACE ) 40 MG tablet Take 1 tablet (40 mg total) by mouth 2 (two) times daily. Can increase to two tablets twice a day in the event of heavy bleeding 11/18/22   Zina Jerilynn LABOR, MD  methocarbamol  (ROBAXIN ) 500 MG tablet Take 1 tablet (500 mg total) by mouth 2 (two) times daily. Patient taking differently: Take 500 mg by mouth 2 (  two) times daily as needed. 07/24/22   Hildegard Loge, PA-C  misoprostol  (CYTOTEC ) 100 MCG tablet 1/2 tab per vagina the night before and the morning of the procedure Patient not taking: Reported on 06/07/2023 11/06/22   Zina Jerilynn LABOR, MD    Family History Family History  Problem Relation Age of Onset   Alzheimer's disease Maternal Grandmother    Stroke Father    Anuerysm Mother     Social History Social History   Tobacco Use   Smoking status: Never   Smokeless tobacco: Never  Vaping Use   Vaping status: Never Used  Substance Use Topics   Alcohol use: Yes    Comment: occasional   Drug use: Never     Allergies   Latex and Stadol [butorphanol]   Review of Systems Review of Systems  Neurological:  Positive for dizziness.     Physical Exam Triage Vital Signs ED Triage Vitals  Encounter Vitals Group     BP 06/07/23 1720 (!) 152/96     Systolic BP Percentile --      Diastolic BP Percentile --      Pulse Rate 06/07/23 1720 82     Resp 06/07/23 1720 17      Temp 06/07/23 1720 97.9 F (36.6 C)     Temp Source 06/07/23 1720 Oral     SpO2 06/07/23 1720 100 %     Weight --      Height --      Head Circumference --      Peak Flow --      Pain Score 06/07/23 1719 5     Pain Loc --      Pain Education --      Exclude from Growth Chart --    No data found.  Updated Vital Signs BP (!) 152/96 (BP Location: Right Arm)   Pulse 82   Temp 97.9 F (36.6 C) (Oral)   Resp 17   SpO2 100%    Physical Exam Vitals and nursing note reviewed.  Constitutional:      Appearance: Normal appearance. She is normal weight. She is not toxic-appearing.  HENT:     Head: Normocephalic and atraumatic.     Right Ear: Tympanic membrane, ear canal and external ear normal.     Left Ear: Tympanic membrane, ear canal and external ear normal.     Nose: Nose normal.     Mouth/Throat:     Mouth: Mucous membranes are moist.  Eyes:     Extraocular Movements: Extraocular movements intact.     Conjunctiva/sclera: Conjunctivae normal.     Pupils: Pupils are equal, round, and reactive to light.  Cardiovascular:     Rate and Rhythm: Normal rate and regular rhythm.     Pulses: Normal pulses.     Heart sounds: Normal heart sounds.  Pulmonary:     Effort: Pulmonary effort is normal.     Breath sounds: Normal breath sounds.  Musculoskeletal:        General: Normal range of motion.     Cervical back: Normal range of motion and neck supple.  Skin:    General: Skin is warm and dry.  Neurological:     General: No focal deficit present.     Mental Status: She is alert and oriented to person, place, and time.  Psychiatric:        Mood and Affect: Mood normal.        Behavior: Behavior normal.  Thought Content: Thought content normal.        Judgment: Judgment normal.      UC Treatments / Results  Labs (all labs ordered are listed, but only abnormal results are displayed) Labs Reviewed - No data to display  EKG   Radiology No results  found.  Procedures Procedures (including critical care time)  Medications Ordered in UC Medications - No data to display  Initial Impression / Assessment and Plan / UC Course  I have reviewed the triage vital signs and the nursing notes.  Pertinent labs & imaging results that were available during my care of the patient were reviewed by me and considered in my medical decision making (see chart for details).     No red flags on exam. Her clinical picture is c/w HTN. Start on Amlodipine  as directed. Pt aware of risks vs benefits and possible adverse reactions. Monitor readings at home. She will f/up with a PCP. RTC precautions discussed. Pt agreeable and understanding of plan.   Final Clinical Impressions(s) / UC Diagnoses   Final diagnoses:  Essential hypertension     Discharge Instructions      Good to meet you today.  Please refer to the handouts provided for more information lowering your blood pressure.  Start on amlodipine  5 mg this evening.  You may take 1/2 tablet for the next few days and see how you are tolerating it.  Take this medicine around the same time each day.  Monitor your blood pressure at home, usually once a day.  Goal blood pressure is going to be less than 140/90.  Keep working on good lifestyle habits, stress management, pain management.  You may take Tylenol  or ibuprofen  as needed to help with any pain.  Hopefully, things will improve after your fibroid surgery.  You will need to follow-up with her primary care provider.  Best wishes! Happy to see you back if any concerns.      ED Prescriptions     Medication Sig Dispense Auth. Provider   amLODipine  (NORVASC ) 5 MG tablet Take 1 tablet (5 mg total) by mouth daily. 30 tablet Hailley Byers M, PA-C      PDMP not reviewed this encounter.   AllwardtMardy HERO, PA-C 06/07/23 1750

## 2023-06-10 ENCOUNTER — Other Ambulatory Visit: Payer: Self-pay | Admitting: Obstetrics and Gynecology

## 2023-06-10 DIAGNOSIS — Z01818 Encounter for other preprocedural examination: Secondary | ICD-10-CM

## 2023-06-10 DIAGNOSIS — D250 Submucous leiomyoma of uterus: Secondary | ICD-10-CM

## 2023-06-10 MED ORDER — MISOPROSTOL 100 MCG PO TABS: ORAL_TABLET | ORAL | 0 refills | Status: DC

## 2023-06-10 NOTE — Progress Notes (Signed)
 Rx for preop cytote sent

## 2023-06-11 ENCOUNTER — Telehealth: Payer: Self-pay

## 2023-06-11 NOTE — Telephone Encounter (Signed)
 Called pt to ensure that she has pre-op meds and education before upcoming procedure. No answer, left vm to call.

## 2023-06-12 ENCOUNTER — Telehealth: Payer: Self-pay | Admitting: *Deleted

## 2023-06-12 ENCOUNTER — Encounter (HOSPITAL_BASED_OUTPATIENT_CLINIC_OR_DEPARTMENT_OTHER): Payer: Self-pay | Admitting: Obstetrics and Gynecology

## 2023-06-12 NOTE — Telephone Encounter (Signed)
 TC. No answer. Left HIPAA compliant msg with call back number and indication that a [facility] msg would be sent.

## 2023-06-12 NOTE — Progress Notes (Signed)
 Spoke w/ via phone for pre-op interview--- pt Lab needs dos---- cbc, t&s, istat, EKG, urine preg        Lab results------ no COVID test -----patient states asymptomatic no test needed Arrive at ------- 0530 on 06-15-2023 NPO after MN w/ exception sips of water  with meds Med rec completed Medications to take morning of surgery ----- norvasc , megace , iron  Diabetic medication ----- n/a Patient instructed no nail polish to be worn day of surgery Patient instructed to bring photo id and insurance card day of surgery Patient aware to have Driver (ride ) / caregiver    for 24 hours after surgery - sister, bernadette  Patient Special Instructions ----- n/a Pre-Op special Instructions ----- n/a Patient verbalized understanding of instructions that were given at this phone interview. Patient denies chest pain, sob, fever, cough at the interview.

## 2023-06-13 NOTE — Anesthesia Preprocedure Evaluation (Addendum)
 Anesthesia Evaluation  Patient identified by MRN, date of birth, ID band Patient awake    Reviewed: Allergy & Precautions, H&P , NPO status , Patient's Chart, lab work & pertinent test results  Airway Mallampati: I  TM Distance: >3 FB Neck ROM: Full    Dental no notable dental hx. (+) Teeth Intact, Dental Advisory Given   Pulmonary neg pulmonary ROS   Pulmonary exam normal breath sounds clear to auscultation       Cardiovascular Exercise Tolerance: Good hypertension, Pt. on medications  Rhythm:Regular Rate:Normal     Neuro/Psych negative neurological ROS  negative psych ROS   GI/Hepatic negative GI ROS, Neg liver ROS,,,  Endo/Other  negative endocrine ROS    Renal/GU negative Renal ROS  negative genitourinary   Musculoskeletal   Abdominal   Peds  Hematology  (+) Blood dyscrasia, anemia   Anesthesia Other Findings   Reproductive/Obstetrics negative OB ROS                             Anesthesia Physical Anesthesia Plan  ASA: 2  Anesthesia Plan: General   Post-op Pain Management: Tylenol  PO (pre-op)* and Toradol  IV (intra-op)*   Induction: Intravenous  PONV Risk Score and Plan: 4 or greater and Ondansetron , Dexamethasone  and Midazolam   Airway Management Planned: LMA  Additional Equipment:   Intra-op Plan:   Post-operative Plan: Extubation in OR  Informed Consent: I have reviewed the patients History and Physical, chart, labs and discussed the procedure including the risks, benefits and alternatives for the proposed anesthesia with the patient or authorized representative who has indicated his/her understanding and acceptance.     Dental advisory given  Plan Discussed with: CRNA  Anesthesia Plan Comments:        Anesthesia Quick Evaluation

## 2023-06-15 ENCOUNTER — Other Ambulatory Visit: Payer: Self-pay

## 2023-06-15 ENCOUNTER — Ambulatory Visit (HOSPITAL_BASED_OUTPATIENT_CLINIC_OR_DEPARTMENT_OTHER): Payer: Self-pay | Admitting: Anesthesiology

## 2023-06-15 ENCOUNTER — Encounter (HOSPITAL_BASED_OUTPATIENT_CLINIC_OR_DEPARTMENT_OTHER): Payer: Self-pay | Admitting: Obstetrics and Gynecology

## 2023-06-15 ENCOUNTER — Encounter (HOSPITAL_BASED_OUTPATIENT_CLINIC_OR_DEPARTMENT_OTHER)
Admission: RE | Disposition: A | Discharge status: Home / Self Care | Payer: Self-pay | Source: Home / Self Care | Attending: Obstetrics and Gynecology

## 2023-06-15 ENCOUNTER — Ambulatory Visit (HOSPITAL_BASED_OUTPATIENT_CLINIC_OR_DEPARTMENT_OTHER)
Admission: RE | Admit: 2023-06-15 | Discharge: 2023-06-15 | Disposition: A | Discharge status: Home / Self Care | Payer: BC Managed Care – PPO | Attending: Obstetrics and Gynecology | Admitting: Obstetrics and Gynecology

## 2023-06-15 ENCOUNTER — Ambulatory Visit: Payer: BC Managed Care – PPO | Admitting: Advanced Practice Midwife

## 2023-06-15 ENCOUNTER — Other Ambulatory Visit: Payer: Self-pay | Admitting: Physician Assistant

## 2023-06-15 DIAGNOSIS — D250 Submucous leiomyoma of uterus: Secondary | ICD-10-CM | POA: Diagnosis present

## 2023-06-15 DIAGNOSIS — I10 Essential (primary) hypertension: Secondary | ICD-10-CM | POA: Insufficient documentation

## 2023-06-15 DIAGNOSIS — D251 Intramural leiomyoma of uterus: Secondary | ICD-10-CM

## 2023-06-15 DIAGNOSIS — D500 Iron deficiency anemia secondary to blood loss (chronic): Secondary | ICD-10-CM

## 2023-06-15 DIAGNOSIS — N938 Other specified abnormal uterine and vaginal bleeding: Secondary | ICD-10-CM | POA: Diagnosis not present

## 2023-06-15 DIAGNOSIS — D649 Anemia, unspecified: Secondary | ICD-10-CM

## 2023-06-15 DIAGNOSIS — D252 Subserosal leiomyoma of uterus: Secondary | ICD-10-CM

## 2023-06-15 DIAGNOSIS — Z01818 Encounter for other preprocedural examination: Secondary | ICD-10-CM

## 2023-06-15 HISTORY — DX: Iron deficiency anemia secondary to blood loss (chronic): D50.0

## 2023-06-15 HISTORY — DX: Constipation, unspecified: K59.00

## 2023-06-15 LAB — POCT I-STAT, CHEM 8
BUN: 19 mg/dL (ref 6–20)
Calcium, Ion: 1.14 mmol/L — ABNORMAL LOW (ref 1.15–1.40)
Chloride: 106 mmol/L (ref 98–111)
Creatinine, Ser: 0.9 mg/dL (ref 0.44–1.00)
Glucose, Bld: 94 mg/dL (ref 70–99)
HCT: 34 % — ABNORMAL LOW (ref 36.0–46.0)
Hemoglobin: 11.6 g/dL — ABNORMAL LOW (ref 12.0–15.0)
Potassium: 3.7 mmol/L (ref 3.5–5.1)
Sodium: 138 mmol/L (ref 135–145)
TCO2: 21 mmol/L — ABNORMAL LOW (ref 22–32)

## 2023-06-15 LAB — TYPE AND SCREEN
ABO/RH(D): O POS
Antibody Screen: NEGATIVE

## 2023-06-15 LAB — POCT PREGNANCY, URINE
Preg Test, Ur: NEGATIVE
Preg Test, Ur: NEGATIVE

## 2023-06-15 LAB — CBC
HCT: 30.6 % — ABNORMAL LOW (ref 36.0–46.0)
Hemoglobin: 9.6 g/dL — ABNORMAL LOW (ref 12.0–15.0)
MCH: 22.4 pg — ABNORMAL LOW (ref 26.0–34.0)
MCHC: 31.4 g/dL (ref 30.0–36.0)
MCV: 71.3 fL — ABNORMAL LOW (ref 80.0–100.0)
Platelets: 424 10*3/uL — ABNORMAL HIGH (ref 150–400)
RBC: 4.29 MIL/uL (ref 3.87–5.11)
RDW: 19.1 % — ABNORMAL HIGH (ref 11.5–15.5)
WBC: 7.2 10*3/uL (ref 4.0–10.5)
nRBC: 0 % (ref 0.0–0.2)

## 2023-06-15 SURGERY — RADIOFREQUENCY ABLATION, LEIOMYOMA, UTERUS, TRANSCERVICAL APPROACH, WITH US GUIDANCE
Anesthesia: General | Site: Vagina

## 2023-06-15 MED ORDER — OXYCODONE HCL 5 MG PO TABS
ORAL_TABLET | ORAL | Status: AC
  Filled 2023-06-15: qty 1

## 2023-06-15 MED ORDER — FENTANYL CITRATE (PF) 100 MCG/2ML IJ SOLN
INTRAMUSCULAR | Status: AC
  Filled 2023-06-15: qty 2

## 2023-06-15 MED ORDER — MIDAZOLAM HCL 2 MG/2ML IJ SOLN
INTRAMUSCULAR | Status: AC
  Filled 2023-06-15: qty 2

## 2023-06-15 MED ORDER — PROPOFOL 10 MG/ML IV BOLUS
INTRAVENOUS | Status: AC
  Filled 2023-06-15: qty 20

## 2023-06-15 MED ORDER — DEXTROSE-SODIUM CHLORIDE 5-0.45 % IV SOLN: INTRAVENOUS | Status: DC

## 2023-06-15 MED ORDER — FENTANYL CITRATE (PF) 100 MCG/2ML IJ SOLN
25.0000 ug | INTRAMUSCULAR | Status: DC | PRN
  Administered 2023-06-15: 50 ug via INTRAVENOUS

## 2023-06-15 MED ORDER — DEXMEDETOMIDINE HCL IN NACL 80 MCG/20ML IV SOLN
INTRAVENOUS | Status: AC
  Filled 2023-06-15: qty 20

## 2023-06-15 MED ORDER — ACETAMINOPHEN 500 MG PO TABS
1000.0000 mg | ORAL_TABLET | ORAL | Status: AC
  Administered 2023-06-15: 1000 mg via ORAL

## 2023-06-15 MED ORDER — ONDANSETRON HCL 4 MG/2ML IJ SOLN
INTRAMUSCULAR | Status: DC | PRN
  Administered 2023-06-15: 4 mg via INTRAVENOUS

## 2023-06-15 MED ORDER — KETOROLAC TROMETHAMINE 30 MG/ML IJ SOLN
INTRAMUSCULAR | Status: AC
  Filled 2023-06-15: qty 1

## 2023-06-15 MED ORDER — STERILE WATER FOR IRRIGATION IR SOLN
Status: DC | PRN
  Administered 2023-06-15: 500 mL

## 2023-06-15 MED ORDER — DEXMEDETOMIDINE HCL IN NACL 80 MCG/20ML IV SOLN
INTRAVENOUS | Status: DC | PRN
  Administered 2023-06-15 (×2): 4 ug via INTRAVENOUS

## 2023-06-15 MED ORDER — PROPOFOL 10 MG/ML IV BOLUS
INTRAVENOUS | Status: DC | PRN
  Administered 2023-06-15: 140 mg via INTRAVENOUS

## 2023-06-15 MED ORDER — DEXAMETHASONE SODIUM PHOSPHATE 10 MG/ML IJ SOLN
INTRAMUSCULAR | Status: AC
  Filled 2023-06-15: qty 1

## 2023-06-15 MED ORDER — LIDOCAINE HCL (PF) 2 % IJ SOLN
INTRAMUSCULAR | Status: AC
  Filled 2023-06-15: qty 5

## 2023-06-15 MED ORDER — MIDAZOLAM HCL 5 MG/5ML IJ SOLN
INTRAMUSCULAR | Status: DC | PRN
  Administered 2023-06-15: 2 mg via INTRAVENOUS

## 2023-06-15 MED ORDER — KETOROLAC TROMETHAMINE 30 MG/ML IJ SOLN
INTRAMUSCULAR | Status: DC | PRN
  Administered 2023-06-15: 30 mg via INTRAVENOUS

## 2023-06-15 MED ORDER — LIDOCAINE HCL 1 % IJ SOLN
INTRAMUSCULAR | Status: DC | PRN
  Administered 2023-06-15: 8 mL

## 2023-06-15 MED ORDER — FENTANYL CITRATE (PF) 100 MCG/2ML IJ SOLN
INTRAMUSCULAR | Status: DC | PRN
  Administered 2023-06-15 (×2): 50 ug via INTRAVENOUS

## 2023-06-15 MED ORDER — OXYCODONE HCL 5 MG PO TABS
5.0000 mg | ORAL_TABLET | Freq: Once | ORAL | Status: AC
  Administered 2023-06-15: 5 mg via ORAL

## 2023-06-15 MED ORDER — DEXAMETHASONE SODIUM PHOSPHATE 10 MG/ML IJ SOLN
INTRAMUSCULAR | Status: DC | PRN
  Administered 2023-06-15: 10 mg via INTRAVENOUS

## 2023-06-15 MED ORDER — POVIDONE-IODINE 10 % EX SWAB: 2.0000 | Freq: Once | CUTANEOUS | Status: DC

## 2023-06-15 MED ORDER — IBUPROFEN 800 MG PO TABS: 800.0000 mg | ORAL_TABLET | Freq: Three times a day (TID) | ORAL | 2 refills | Status: AC | PRN

## 2023-06-15 MED ORDER — ONDANSETRON HCL 4 MG/2ML IJ SOLN
INTRAMUSCULAR | Status: AC
  Filled 2023-06-15: qty 2

## 2023-06-15 MED ORDER — SOD CITRATE-CITRIC ACID 500-334 MG/5ML PO SOLN: 30.0000 mL | ORAL | Status: DC

## 2023-06-15 MED ORDER — LIDOCAINE 2% (20 MG/ML) 5 ML SYRINGE
INTRAMUSCULAR | Status: DC | PRN
  Administered 2023-06-15: 60 mg via INTRAVENOUS

## 2023-06-15 MED ORDER — ACETAMINOPHEN 500 MG PO TABS
ORAL_TABLET | ORAL | Status: AC
  Filled 2023-06-15: qty 2

## 2023-06-15 MED ORDER — LACTATED RINGERS IV SOLN: INTRAVENOUS | Status: DC

## 2023-06-15 SURGICAL SUPPLY — 20 items
CATH ROBINSON RED A/P 16FR (CATHETERS) ×2 IMPLANT
CATH SILICONE 16FRX5CC (CATHETERS) IMPLANT
DEVICE MYOSURE LITE (MISCELLANEOUS) IMPLANT
DEVICE MYOSURE REACH (MISCELLANEOUS) IMPLANT
ELECT DISPERSIVE SONATA (ELECTRODE) ×2 IMPLANT
GAUZE 4X4 16PLY ~~LOC~~+RFID DBL (SPONGE) IMPLANT
GLOVE BIO SURGEON STRL SZ8 (GLOVE) ×2 IMPLANT
GOWN STRL REUS W/TWL LRG LVL3 (GOWN DISPOSABLE) ×2 IMPLANT
HANDPIECE RFA SONATA (MISCELLANEOUS) ×2 IMPLANT
KIT PROCEDURE FLUENT (KITS) IMPLANT
KIT TURNOVER CYSTO (KITS) ×2 IMPLANT
MYOSURE XL FIBROID (MISCELLANEOUS)
PACK VAGINAL MINOR WOMEN LF (CUSTOM PROCEDURE TRAY) ×2 IMPLANT
PAD OB MATERNITY 4.3X12.25 (PERSONAL CARE ITEMS) ×2 IMPLANT
PAD PREP 24X48 CUFFED NSTRL (MISCELLANEOUS) ×2 IMPLANT
SEAL ROD LENS SCOPE MYOSURE (ABLATOR) IMPLANT
SLEEVE SCD COMPRESS KNEE MED (STOCKING) ×2 IMPLANT
SYSTEM TISS REMOVAL MYOSURE XL (MISCELLANEOUS) IMPLANT
TOWEL OR 17X24 6PK STRL BLUE (TOWEL DISPOSABLE) ×2 IMPLANT
WATER STERILE IRR 500ML POUR (IV SOLUTION) IMPLANT

## 2023-06-15 NOTE — Anesthesia Procedure Notes (Signed)
 Procedure Name: LMA Insertion Date/Time: 06/15/2023 7:40 AM  Performed by: Phillip Maffei D, CRNAPre-anesthesia Checklist: Patient identified, Emergency Drugs available, Suction available and Patient being monitored Patient Re-evaluated:Patient Re-evaluated prior to induction Oxygen Delivery Method: Circle system utilized Preoxygenation: Pre-oxygenation with 100% oxygen Induction Type: IV induction Ventilation: Mask ventilation without difficulty LMA: LMA inserted LMA Size: 3.0 Tube type: Oral Number of attempts: 1 Placement Confirmation: positive ETCO2 and breath sounds checked- equal and bilateral Tube secured with: Tape Dental Injury: Teeth and Oropharynx as per pre-operative assessment

## 2023-06-15 NOTE — Discharge Instructions (Signed)

## 2023-06-15 NOTE — H&P (Signed)
 OB/GYN Pre-Op History and Physical  Susan Cruz is a 48 y.o. [phone] presenting for Sonata  uterine fibroid embolization.  Procedure previously discussed in detail.  Risks and benefits of the procedure discussed in detail including bleeding, infection, involvement of other organs and uterine perforation.  Case previously canceled due to sverely low hemoglobin for an elective procedure.       Past Medical History:  Diagnosis Date   Constipation    Edema of both lower extremities    per pt feet   History of panic attacks    Hypertension, essential 06/07/2023   06-12-2023 ---- newly dx at Urgent care visit in epic ;  pt stated checking bp at home ,  ranging 155-159/  75-96,   given norvasc  5 to start on and told to monitor at home and referred to a pcp;  pt stated has a initial visit to establish pcp appointment w/ Dr Kennieth Leech on 06-16-2023;  stated bp at home since taking norvasc  , systolic 137-176   Iron  deficiency anemia due to chronic blood loss    (06-12-2023  symptomatic per pt w/ fatigue, intermittant dizziness) due to uterine fibroids;   pt taking oral iron  daily and transfusion 1 unit PRBC 10-15-2022 for HG 6.5   Uterine fibroid    symptomatic    Past Surgical History:  Procedure Laterality Date   TUBAL LIGATION Bilateral 08/26/2000   @[facility]  by dr rudy;   PPTL w/ epidural    OB History  Gravida Para Term Preterm AB Living  3 3 2 1  3   SAB IAB Ectopic Multiple Live Births          # Outcome Date GA Lbr Len/2nd Weight Sex Type Anes PTL Lv  3 Term           2 Term           1 Preterm             Obstetric Comments  SVD x 3    Social History   Socioeconomic History   Marital status: Divorced    Spouse name: Not on file   Number of children: Not on file   Years of education: Not on file   Highest education level: Not on file  Occupational History   Occupation: Scientist, Product/process Development support  Tobacco Use   Smoking status: Never   Smokeless tobacco: Never  Vaping  Use   Vaping status: Never Used  Substance and Sexual Activity   Alcohol use: Yes    Comment: occasional   Drug use: Never   Sexual activity: Yes    Birth control/protection: Abstinence  Other Topics Concern   Not on file  Social History Narrative   Work for [employer], clinical biochemist. Lives with 31 year older daughter and 66 years old son. Has 3 children the last is currently in college.    Social Drivers of Corporate Investment Banker Strain: Not on file  Food Insecurity: Not on file  Transportation Needs: Not on file  Physical Activity: Not on file  Stress: Not on file  Social Connections: Not on file    Family History  Problem Relation Age of Onset   Alzheimer's disease Maternal Grandmother    Stroke Father    Anuerysm Mother     Medications Prior to Admission  Medication Sig Dispense Refill Last Dose/Taking   amLODipine  (NORVASC ) 5 MG tablet Take 1 tablet (5 mg total) by mouth daily. (Patient taking differently: Take 5 mg by mouth  daily.) 30 tablet 0 06/14/2023   Iron  Polysacch Cmplx-B12-FA 150-0.025-1 MG CAPS Take 1 capsule by mouth every other day. (Patient taking differently: Take 1 capsule by mouth daily.) 30 capsule 5 06/15/2023 at  5:54 AM   megestrol  (MEGACE ) 40 MG tablet Take 1 tablet (40 mg total) by mouth 2 (two) times daily. Can increase to two tablets twice a day in the event of heavy bleeding 60 tablet 5 06/15/2023 at  5:54 AM   misoprostol  (CYTOTEC ) 100 MCG tablet 1/2 tab per vagina the night before and the morning of the procedure 1 tablet 0 06/15/2023 Morning   Multiple Vitamin (MULTIVITAMIN) capsule Take 1 capsule by mouth daily.   06/14/2023   ibuprofen  (ADVIL ) 800 MG tablet Take 800 mg by mouth every 8 (eight) hours as needed.   Unknown   lidocaine  (LIDODERM ) 5 % Place 1 patch onto the skin daily. Remove & Discard patch within 12 hours or as directed by MD (Patient taking differently: Place 1 patch onto the skin as needed (back pain). Remove & Discard patch  within 12 hours or as directed by MD) 14 patch 0 Unknown   methocarbamol  (ROBAXIN ) 500 MG tablet Take 1 tablet (500 mg total) by mouth 2 (two) times daily. (Patient taking differently: Take 500 mg by mouth 2 (two) times daily as needed.) 20 tablet 0 Unknown    Allergies  Allergen Reactions   Latex Hives and Swelling   Stadol [Butorphanol] Itching    Review of Systems: Negative except for what is mentioned in HPI.     Physical Exam: BP 129/79   Pulse 73   Temp 98.3 F (36.8 C) (Oral)   Resp 17   Ht 5' 3.5 (1.613 m)   Wt 49.8 kg   LMP 06/15/2023   SpO2 100%   BMI 19.15 kg/m  CONSTITUTIONAL: Well-developed, well-nourished female in no acute distress.  HENT:  Normocephalic, atraumatic, External right and left ear normal. Oropharynx is clear and moist EYES: Conjunctivae and EOM are normal.  NECK: Normal range of motion, supple, no masses SKIN: Skin is warm and dry. No rash noted. Not diaphoretic. No erythema. No pallor. NEUROLGIC: Alert and oriented to person, place, and time. Normal reflexes, muscle tone coordination. No cranial nerve deficit noted. PSYCHIATRIC: Normal mood and affect. Normal behavior. Normal judgment and thought content. CARDIOVASCULAR: Normal heart rate noted, regular rhythm RESPIRATORY: Effort and breath sounds normal, no problems with respiration noted ABDOMEN: Soft, nontender, nondistended, PELVIC: Deferred MUSCULOSKELETAL: Normal range of motion. No edema and no tenderness. 2+ distal pulses.   Pertinent Labs/Studies:   Results for orders placed or performed during the hospital encounter of 06/15/23 (from the past 72 hours)  CBC     Status: Abnormal   Collection Time: 06/15/23  6:15 AM  Result Value Ref Range   WBC 7.2 4.0 - 10.5 K/uL   RBC 4.29 3.87 - 5.11 MIL/uL   Hemoglobin 9.6 (L) 12.0 - 15.0 g/dL   HCT 69.3 (L) 63.9 - 53.9 %   MCV 71.3 (L) 80.0 - 100.0 fL   MCH 22.4 (L) 26.0 - 34.0 pg   MCHC 31.4 30.0 - 36.0 g/dL   RDW 80.8 (H) 88.4 - 84.4 %    Platelets 424 (H) 150 - 400 K/uL   nRBC 0.0 0.0 - 0.2 %    Comment: Performed at [facility], [address] [place], [place] [address]  I-STAT, nathanael 8     Status: Abnormal   Collection Time: 06/15/23  6:21 AM  Result Value Ref Range   Sodium 138 135 - 145 mmol/L   Potassium 3.7 3.5 - 5.1 mmol/L   Chloride 106 98 - 111 mmol/L   BUN 19 6 - 20 mg/dL   Creatinine, Ser 9.09 0.44 - 1.00 mg/dL   Glucose, Bld 94 70 - 99 mg/dL    Comment: Glucose reference range applies only to samples taken after fasting for at least 8 hours.   Calcium, Ion 1.14 (L) 1.15 - 1.40 mmol/L   TCO2 21 (L) 22 - 32 mmol/L   Hemoglobin 11.6 (L) 12.0 - 15.0 g/dL   HCT 65.9 (L) 63.9 - 53.9 %   CLINICAL DATA:  Abnormal uterine bleeding since 04/03/2022   EXAM: TRANSABDOMINAL AND TRANSVAGINAL ULTRASOUND OF PELVIS   TECHNIQUE: Both transabdominal and transvaginal ultrasound examinations of the pelvis were performed. Transabdominal technique was performed for global imaging of the pelvis including uterus, ovaries, adnexal regions, and pelvic cul-de-sac. It was necessary to proceed with endovaginal exam following the transabdominal exam to visualize the uterus, endometrium, and ovaries.   COMPARISON:  None Available.   FINDINGS: Uterus   Measurements: 5.0 x 2.7 x 5.4 cm = volume: 37 mL. Retroverted, to RIGHT. Several nodular areas are seen within the myometrium consistent with leiomyomata, including 3.9 cm probable subserosal leiomyoma mid uterus, 2.5 cm subserosal leiomyoma posterior LEFT uterus, and 4.2 cm posterior RIGHT fundal leiomyoma.   Endometrium   Predominantly obscured by leiomyomata, with a short segment 2 mm thick seen adjacent to a small amount of mid uterine endometrial fluid.   Right ovary   Not visualized, likely obscured by bowel   Left ovary   Not visualized, likely obscured by bowel   Other findings   No free pelvic fluid or adnexal masses.    IMPRESSION: 3 uterine leiomyomata, 1 of which extends submucosal.   Nonvisualization of ovaries and suboptimal visualization of endometrial complex as above.        Assessment and Plan :Susan Cruz is a 48 y.o. [phone] here for Sonata  uterine fibroid radioablation.   Plan for Sonata  procedure NPO Admission labs ordered, hgb acceptable for procedure. VS Q4 Risks and benefits previously discussed.   Jerilynn Buddle, M.D. Attending Obstetrician & Gynecologist, [facility] for [employer], [facility]

## 2023-06-15 NOTE — Transfer of Care (Signed)
 Immediate Anesthesia Transfer of Care Note  Patient: Susan Cruz  Procedure(s) Performed: Radio Frequency Ablation with Sonata  (Vagina )  Patient Location: PACU  Anesthesia Type:General  Level of Consciousness: sedated  Airway & Oxygen Therapy: Patient Spontanous Breathing and Patient connected to nasal cannula oxygen  Post-op Assessment: Report given to RN  Post vital signs: Reviewed and stable  Last Vitals:  Vitals Value Taken Time  BP 130/82 06/15/23 0857  Temp    Pulse 55 06/15/23 0900  Resp 17 06/15/23 0900  SpO2 100 % 06/15/23 0900  Vitals shown include unfiled device data.  Last Pain:  Vitals:   06/15/23 0600  TempSrc: Oral  PainSc: 5       Patients Stated Pain Goal: 5 (06/15/23 0600)  Complications: No notable events documented.

## 2023-06-15 NOTE — Anesthesia Postprocedure Evaluation (Signed)
 Anesthesia Post Note  Patient: Susan Cruz  Procedure(s) Performed: Radio Frequency Ablation with Sonata  (Vagina )     Patient location during evaluation: PACU Anesthesia Type: General Level of consciousness: awake and alert Pain management: pain level controlled Vital Signs Assessment: post-procedure vital signs reviewed and stable Respiratory status: spontaneous breathing, nonlabored ventilation and respiratory function stable Cardiovascular status: blood pressure returned to baseline and stable Postop Assessment: no apparent nausea or vomiting Anesthetic complications: no  No notable events documented.  Last Vitals:  Vitals:   06/15/23 1026 06/15/23 1124  BP:  117/75  Pulse: (!) 54 (!) 59  Resp: 16 12  Temp: 36.7 C   SpO2: 98% 100%    Last Pain:  Vitals:   06/15/23 1100  TempSrc:   PainSc: 6                  Jacolyn Joaquin,W. EDMOND

## 2023-06-15 NOTE — Op Note (Signed)
 PREOPERATIVE DIAGNOSIS: Symptomatic uterine fibroids POSTOPERATIVE DIAGNOSIS: The same PROCEDURE: Transcervical Sonata  uterine fibroid radiofrequency ablation, Hysteroscopy, Dilation and Curettage. SURGEON:  Dr. Jerilynn Buddle   INDICATIONS: 48 y.o. [phone]  here for scheduled surgery for the aforementioned diagnoses.   Risks of surgery were discussed with the patient including but not limited to: bleeding which may require transfusion; infection which may require antibiotics; injury to uterus or surrounding organs; intrauterine scarring which may impair future fertility; need for additional procedures including laparotomy or laparoscopy; and other postoperative/anesthesia complications. Written informed consent was obtained.    FINDINGS:  A 6.5 week size uterus.   Fibroid A  :  cluster of small fibroids Fibroid  B: 2.3 cm Fibroid C : 3.4 cm Fibroid D : 1.7 cm Fibroid E : 1.3 cm ANESTHESIA:   General, paracervical block with 30 ml of 0.5% Marcaine INTRAVENOUS FLUIDS:  400 ml of LR ESTIMATED BLOOD LOSS:  Less than 25 ml UOP: 50 ml COMPLICATIONS:  None immediate.  PROCEDURE DETAILS:  After administering anesthesia, the patient was identified and a time out was performed and procedure was confirmed.  The patient was placed in dorsal lithotomy position and an exam under anesthesia revealed a mobile fibroid uterus and normal adnexa.  A speculum was placed, cervix was grasped with a tenaculum and sounded to a depth of 5.5 centimeters.  The cervix was easily dilated to a #27 Pratt dilator.   The Sonata  handpiece was then inserted and a complete uterine survey was performed with the ultrasound.  Sonata  fibroid ablation was then performed.  Fibroid 1:   12 o'clock, ablation zone 1.8 x 1.2 cm, 40 seconds Fibroid 2:    3 oclock,   ablation zone 1 1.9 x 1.3, 50 seconds                                     Ablation zone 1.9 x 1.3, 50 seconds                                     Ablation zone 2.0 x 1.3,   one minute  6 seconds Fibroid 3:   6 o'clock    ablation zone 2.1 x 1.5, one minute 24 seconds Fibroid 4:   9 o'clock    ablation zone 1.6 x 1.2 cm, 15 seconds Fibroid 5:   return to 12 o'clock ablation zone 1.6 x 1.2 cm, 15 seconds  All cycles were carried out under direct ultrasound intrauterine guidance and visualization with the ablation guide noted to be within the serosa at all times.  The fibroids appeared ablated with u/s guidance and outgassing noted by U/S appearance.  Sponge, needle, and instrument account was correct x 2.  All instruments were removed from the vagina.  Hemostasis was assured.  The patient was then awakened and taken to the recovery room I stable condition, tolerating the procedure well.  The patient will be discharged to home as per PACU criteria.  Routine postoperative instructions given.  She was prescribed Ibuprofen .  She will follow up in the clinic in 2-3 weeks  for postoperative evaluation.   Jerilynn Buddle, MD, FACOG Obstetrician & Gynecologist, [facility] for [facility], [facility]

## 2023-06-17 ENCOUNTER — Telehealth: Payer: Self-pay

## 2023-06-17 NOTE — Telephone Encounter (Signed)
 TC to pt about UTI symptoms, pt aware she would need a nurse visit to leave a urine sample, message sent to front to schedule.

## 2023-06-18 ENCOUNTER — Encounter: Payer: Self-pay | Admitting: *Deleted

## 2023-06-23 ENCOUNTER — Ambulatory Visit: Payer: BC Managed Care – PPO

## 2023-06-29 ENCOUNTER — Ambulatory Visit: Payer: BC Managed Care – PPO | Admitting: General Practice

## 2023-06-29 VITALS — BP 108/62 | HR 68 | Ht 63.5 in | Wt 112.6 lb

## 2023-06-29 DIAGNOSIS — N390 Urinary tract infection, site not specified: Secondary | ICD-10-CM | POA: Diagnosis not present

## 2023-06-29 DIAGNOSIS — R319 Hematuria, unspecified: Secondary | ICD-10-CM | POA: Diagnosis not present

## 2023-06-29 DIAGNOSIS — D649 Anemia, unspecified: Secondary | ICD-10-CM

## 2023-06-29 LAB — POCT URINALYSIS DIPSTICK
Bilirubin, UA: NEGATIVE
Glucose, UA: NEGATIVE
Ketones, UA: NEGATIVE
Leukocytes, UA: NEGATIVE
Nitrite, UA: NEGATIVE
Protein, UA: NEGATIVE
Spec Grav, UA: 1.015 (ref 1.010–1.025)
Urobilinogen, UA: 0.2 U/dL
pH, UA: 6 (ref 5.0–8.0)

## 2023-06-29 MED ORDER — IRON POLYSACCH CMPLX-B12-FA 150-0.025-1 MG PO CAPS: 1.0000 | ORAL_CAPSULE | ORAL | 5 refills | Status: AC

## 2023-06-29 NOTE — Progress Notes (Signed)
SUBJECTIVE: Susan Cruz is a 48 y.o. female who complains of urinary frequency, urgency and dysuria x 2 weeks days, without flank pain, fever, chills, or abnormal vaginal discharge. Pt had Sonata ablation on 06/15/23. Pt reports vaginal bleeding and abd cramping since procedure.   OBJECTIVE: Appears well, in no apparent distress.  Vital signs are normal. Urine dipstick shows positive for RBC's.    ASSESSMENT: Dysuria  PLAN: Treatment per orders.  Call or return to clinic prn if these symptoms worsen or fail to improve as anticipated.

## 2023-07-02 LAB — URINE CULTURE

## 2023-07-03 ENCOUNTER — Encounter: Payer: Self-pay | Admitting: Obstetrics and Gynecology

## 2023-07-03 MED ORDER — SULFAMETHOXAZOLE-TRIMETHOPRIM 800-160 MG PO TABS: 1.0000 | ORAL_TABLET | Freq: Two times a day (BID) | ORAL | 1 refills | Status: AC

## 2023-07-03 NOTE — Addendum Note (Signed)
Addended by: Catalina Antigua on: 07/03/2023 11:44 AM   Modules accepted: Orders

## 2023-07-07 ENCOUNTER — Ambulatory Visit: Payer: BC Managed Care – PPO | Admitting: Obstetrics and Gynecology

## 2023-07-20 ENCOUNTER — Encounter: Payer: Self-pay | Admitting: Advanced Practice Midwife

## 2023-07-20 ENCOUNTER — Ambulatory Visit (INDEPENDENT_AMBULATORY_CARE_PROVIDER_SITE_OTHER): Payer: BC Managed Care – PPO | Admitting: Advanced Practice Midwife

## 2023-07-20 VITALS — BP 108/71 | HR 64 | Ht 63.5 in | Wt 108.6 lb

## 2023-07-20 DIAGNOSIS — Z9889 Other specified postprocedural states: Secondary | ICD-10-CM

## 2023-07-20 DIAGNOSIS — N63 Unspecified lump in unspecified breast: Secondary | ICD-10-CM

## 2023-07-20 DIAGNOSIS — Z01419 Encounter for gynecological examination (general) (routine) without abnormal findings: Secondary | ICD-10-CM | POA: Diagnosis not present

## 2023-07-20 NOTE — Progress Notes (Signed)
Pt presents for AEX. Requesting refill of iron. Mammogram 05/2023 at [facility]. Diagnostic mammogram was recommended for left breast. Declines STD testing.  Missed f/u with Dr. Donavan Foil after ablation

## 2023-07-20 NOTE — Progress Notes (Signed)
Subjective:     Susan Cruz is a 48 y.o. female here at [facility] for a routine exam. She is s/p endometrial ablation on 06/27/23. She is doing well since the surgery, no pain or other complications. She has scant/light bleeding since the procedure but it is getting to be less and less.  She had an abnormal mammogram in December and a diagnostic mammogram of the left breast was recommended. Current complaints: none.  Personal and family health history reviewed: yes.  Do you have a primary care provider? yes  Flowsheet Row Office Visit from 10/01/2021 in [facility] Total Score 0       Health Maintenance Due  Topic Date Due   MAMMOGRAM  Never done   Hepatitis C Screening  Never done   DTaP/Tdap/Td (1 - Tdap) Never done   Colonoscopy  Never done   INFLUENZA VACCINE  Never done   COVID-19 Vaccine (1 - 2024-25 season) Never done     Risk factors for chronic health problems: Smoking: Alchohol/how much: Pt BMI: Body mass index is 18.94 kg/m.   Gynecologic History No LMP recorded. Patient has had an ablation. Contraception: tubal ligation Last Pap: 10/01/21. Results were: normal Last mammogram: 05/2023. Results were: abnormal  Obstetric History OB History  Gravida Para Term Preterm AB Living  3 3 2 1  3   SAB IAB Ectopic Multiple Live Births          # Outcome Date GA Lbr Len/2nd Weight Sex Type Anes PTL Lv  3 Term           2 Term           1 Preterm             Obstetric Comments  SVD x 3     The following portions of the patient's history were reviewed and updated as appropriate: allergies, current medications, past family history, past medical history, past social history, past surgical history, and problem list.  Review of Systems Pertinent items noted in HPI and remainder of comprehensive ROS otherwise negative.    Objective:  BP 108/71   Pulse 64   Ht 5' 3.5" (1.613 m)   Wt 108 lb 9.6 oz (49.3 kg)   BMI  18.94 kg/m   VS reviewed, nursing note reviewed,  Constitutional: well developed, well nourished, no distress HEENT: normocephalic, thyroid without enlargement or mass HEART: RRR, no murmurs rubs/gallops RESP: clear and equal to auscultation bilaterally in all lobes  Breast Exam: exam performed: right breast normal with multiple rope-like smooth masses palpable in upper outer and mid lower portion of breast, no skin or nipple changes or axillary nodes, left breast normal with multiple rope-like smooth masses palpable in upper outer and mid lower portion of breast, no skin or nipple changes or axillary nodes Abdomen: soft Neuro: alert and oriented x 3 Skin: warm, dry Psych: affect normal Pelvic exam Bimanual exam: Cervix 0/long/high, firm, anterior, neg CMT, uterus nontender, nonenlarged, adnexa without tenderness, enlargement, or mass        Assessment/Plan:   1. Encounter for annual routine gynecological examination (Primary) --Pap wnl 10/01/2021 --Mammogram 05/2023, needs follow up diagnostic mammo of left breast. Pt has not scheduled due to cost. It is $500 with insurance. Encouraged pt to get mammogram done as soon as possible.   --Schedule colonoscopy as soon as possible  2. S/P endometrial ablation --Ablation on 06/27/23 with Dr Donavan Foil for uterine fibroids  and AUB, pt missed her follow up visit. She is doing well, no pain, light bleeding since surgery that is slowing down.   3. Fibrous breast lumps --Fibrous/dense breast tissue noted bilaterally. Discussed screening options in the future.  Currently there are no recommendations but MRI is offered but not covered by insurance, or pt may opt to do diagnostic mammogram and ultrasound annually given the increased risks.     No follow-ups on file.   Sharen Counter, CNM 3:44 PM

## 2023-08-06 ENCOUNTER — Ambulatory Visit

## 2023-08-10 ENCOUNTER — Ambulatory Visit

## 2023-08-10 VITALS — BP 117/82 | HR 78

## 2023-08-10 DIAGNOSIS — N3000 Acute cystitis without hematuria: Secondary | ICD-10-CM

## 2023-08-10 DIAGNOSIS — R300 Dysuria: Secondary | ICD-10-CM | POA: Diagnosis not present

## 2023-08-10 LAB — POCT URINALYSIS DIPSTICK
Bilirubin, UA: NEGATIVE
Glucose, UA: NEGATIVE
Ketones, UA: NEGATIVE
Leukocytes, UA: NEGATIVE
Nitrite, UA: NEGATIVE
Protein, UA: NEGATIVE
Spec Grav, UA: 1.02 (ref 1.010–1.025)
Urobilinogen, UA: 0.2 U/dL
pH, UA: 8 (ref 5.0–8.0)

## 2023-08-10 NOTE — Progress Notes (Signed)
 SUBJECTIVE: Susan Cruz is a 48 y.o. female who complains of urinary frequency, urgency and dysuria x 3 days, without flank pain, fever, chills, or abnormal vaginal discharge or bleeding. Pt states she had a UTI not long ago, completed full abx course, but feels as though it is back with the same symptoms.  OBJECTIVE: Appears well, in no apparent distress.  Vital signs are normal. Urine dipstick shows negative for all components.    ASSESSMENT: Dysuria  PLAN: Treatment per orders.  Call or return to clinic prn if these symptoms worsen or fail to improve as anticipated.

## 2023-08-14 ENCOUNTER — Encounter: Payer: Self-pay | Admitting: Obstetrics & Gynecology

## 2023-08-14 LAB — URINE CULTURE

## 2023-08-14 MED ORDER — CEFADROXIL 500 MG PO CAPS: 500.0000 mg | ORAL_CAPSULE | Freq: Two times a day (BID) | ORAL | 0 refills | Status: DC

## 2023-08-14 NOTE — Addendum Note (Signed)
 Addended by: Jaynie Collins A on: 08/14/2023 09:56 AM   Modules accepted: Orders

## 2023-09-22 ENCOUNTER — Other Ambulatory Visit: Payer: Self-pay

## 2023-09-22 DIAGNOSIS — N390 Urinary tract infection, site not specified: Secondary | ICD-10-CM

## 2023-10-22 ENCOUNTER — Telehealth: Payer: Self-pay

## 2023-10-22 NOTE — Telephone Encounter (Signed)
 Call placed to patient. Pt reports recurring issues. Reports cloudy, foul smelling urine. Requesting refill of Duricef until she's able to get in with urology. Message sent provider for further directive.

## 2023-10-22 NOTE — Telephone Encounter (Signed)
 Attempted to reach patient. No answer. Detailed VM left with call back. Per directive, patient will need to come in for nurse visit to r/o UTI. If concerning for UTI, antibiotics will be re-prescribed.

## 2023-11-05 ENCOUNTER — Ambulatory Visit

## 2023-11-09 ENCOUNTER — Ambulatory Visit

## 2024-01-21 ENCOUNTER — Other Ambulatory Visit: Payer: Self-pay | Admitting: Obstetrics & Gynecology

## 2024-01-21 DIAGNOSIS — N3000 Acute cystitis without hematuria: Secondary | ICD-10-CM
